# Patient Record
Sex: Male | Born: 1945 | Race: White | Hispanic: No | Marital: Single | Smoking: Current every day smoker
Health system: Southern US, Community
[De-identification: ages and names within clinical notes are randomized; demographics above are authoritative.]

## PROBLEM LIST (undated history)

## (undated) DIAGNOSIS — J449 Chronic obstructive pulmonary disease, unspecified: Secondary | ICD-10-CM

## (undated) DIAGNOSIS — G459 Transient cerebral ischemic attack, unspecified: Secondary | ICD-10-CM

## (undated) DIAGNOSIS — I10 Essential (primary) hypertension: Secondary | ICD-10-CM

## (undated) HISTORY — PX: OTHER SURGICAL HISTORY: SHX169

---

## 2013-10-07 ENCOUNTER — Encounter (HOSPITAL_COMMUNITY): Payer: Self-pay | Admitting: Emergency Medicine

## 2013-10-07 ENCOUNTER — Emergency Department (HOSPITAL_COMMUNITY): Payer: Medicare Other

## 2013-10-07 ENCOUNTER — Emergency Department (HOSPITAL_COMMUNITY)
Admission: EM | Admit: 2013-10-07 | Discharge: 2013-10-07 | Disposition: A | Discharge status: Home / Self Care | Payer: Medicare Other | Attending: Emergency Medicine | Admitting: Emergency Medicine

## 2013-10-07 DIAGNOSIS — Z79899 Other long term (current) drug therapy: Secondary | ICD-10-CM | POA: Insufficient documentation

## 2013-10-07 DIAGNOSIS — F17200 Nicotine dependence, unspecified, uncomplicated: Secondary | ICD-10-CM | POA: Diagnosis not present

## 2013-10-07 DIAGNOSIS — R0602 Shortness of breath: Secondary | ICD-10-CM | POA: Diagnosis present

## 2013-10-07 DIAGNOSIS — J449 Chronic obstructive pulmonary disease, unspecified: Secondary | ICD-10-CM

## 2013-10-07 DIAGNOSIS — Z8673 Personal history of transient ischemic attack (TIA), and cerebral infarction without residual deficits: Secondary | ICD-10-CM | POA: Diagnosis not present

## 2013-10-07 DIAGNOSIS — I10 Essential (primary) hypertension: Secondary | ICD-10-CM | POA: Diagnosis not present

## 2013-10-07 DIAGNOSIS — J441 Chronic obstructive pulmonary disease with (acute) exacerbation: Secondary | ICD-10-CM | POA: Insufficient documentation

## 2013-10-07 HISTORY — DX: Essential (primary) hypertension: I10

## 2013-10-07 HISTORY — DX: Transient cerebral ischemic attack, unspecified: G45.9

## 2013-10-07 HISTORY — DX: Chronic obstructive pulmonary disease, unspecified: J44.9

## 2013-10-07 LAB — CBC
HCT: 37 % — ABNORMAL LOW (ref 39.0–52.0)
Hemoglobin: 13 g/dL (ref 13.0–17.0)
MCH: 30 pg (ref 26.0–34.0)
MCHC: 35.1 g/dL (ref 30.0–36.0)
MCV: 85.5 fL (ref 78.0–100.0)
Platelets: 250 10*3/uL (ref 150–400)
RBC: 4.33 MIL/uL (ref 4.22–5.81)
RDW: 12.3 % (ref 11.5–15.5)
WBC: 12 10*3/uL — ABNORMAL HIGH (ref 4.0–10.5)

## 2013-10-07 LAB — PRO B NATRIURETIC PEPTIDE: Pro B Natriuretic peptide (BNP): 201.2 pg/mL — ABNORMAL HIGH (ref 0–125)

## 2013-10-07 LAB — BASIC METABOLIC PANEL
ANION GAP: 15 (ref 5–15)
BUN: 17 mg/dL (ref 6–23)
CALCIUM: 9.3 mg/dL (ref 8.4–10.5)
CO2: 25 mEq/L (ref 19–32)
Chloride: 90 mEq/L — ABNORMAL LOW (ref 96–112)
Creatinine, Ser: 0.94 mg/dL (ref 0.50–1.35)
GFR calc Af Amer: >90 mL/min (ref >90)
GFR, EST NON AFRICAN AMERICAN: 83 mL/min — AB (ref >90)
Glucose, Bld: 108 mg/dL — ABNORMAL HIGH (ref 70–99)
Potassium: 3.2 mEq/L — ABNORMAL LOW (ref 3.7–5.3)
Sodium: 130 mEq/L — ABNORMAL LOW (ref 137–147)

## 2013-10-07 LAB — I-STAT TROPONIN, ED: Troponin i, poc: 0 ng/mL (ref 0.00–0.08)

## 2013-10-07 MED ORDER — PREDNISONE 10 MG PO TABS: 20.0000 mg | ORAL_TABLET | Freq: Every day | ORAL | Status: DC

## 2013-10-07 MED ORDER — PREDNISONE 20 MG PO TABS
60.0000 mg | ORAL_TABLET | Freq: Once | ORAL | Status: AC
  Administered 2013-10-07: 60 mg via ORAL
  Filled 2013-10-07: qty 3

## 2013-10-07 MED ORDER — IPRATROPIUM BROMIDE 0.02 % IN SOLN
0.5000 mg | Freq: Once | RESPIRATORY_TRACT | Status: AC
  Administered 2013-10-07: 0.5 mg via RESPIRATORY_TRACT
  Filled 2013-10-07: qty 2.5

## 2013-10-07 MED ORDER — ALBUTEROL SULFATE (2.5 MG/3ML) 0.083% IN NEBU
5.0000 mg | INHALATION_SOLUTION | Freq: Once | RESPIRATORY_TRACT | Status: AC
  Administered 2013-10-07: 5 mg via RESPIRATORY_TRACT
  Filled 2013-10-07: qty 6

## 2013-10-07 MED ORDER — ALBUTEROL SULFATE HFA 108 (90 BASE) MCG/ACT IN AERS
2.0000 | INHALATION_SPRAY | RESPIRATORY_TRACT | Status: DC | PRN
  Administered 2013-10-07: 2 via RESPIRATORY_TRACT
  Filled 2013-10-07: qty 6.7

## 2013-10-07 NOTE — Discharge Instructions (Signed)

## 2013-10-07 NOTE — ED Provider Notes (Signed)
CSN: [id number]     Arrival date & time 10/07/13  4098 History   First MD Initiated Contact with Patient 10/07/13 1018     Chief Complaint  Patient presents with  . Shortness of Breath     (Consider location/radiation/quality/duration/timing/severity/associated sxs/prior Treatment) HPI  PCP: [facility] in [place], [place]  Patient to the Emergency Department with complaints of coughing and shortness of breath for the past two days. He states that he does have COPD but feels he may have bronchitis, he says that he has been coughing up mucous but it has been difficult to get the mucous to come up. He has not had any chest pain or fevers. He has not had any abdominal or lower extremity swelling. He does endorse Orthopnea and that he begins to cough if he lays down flat. Symptoms improved with the use of his albuterol inhaler. PMH also positive for hypertension, and a TIA.    Past Medical History  Diagnosis Date  . COPD (chronic obstructive pulmonary disease)   . Hypertension   . Transient ischemic attack (TIA)    Past Surgical History  Procedure Laterality Date  . Hemorrohoid surgery     No family history on file. History  Substance Use Topics  . Smoking status: Current Every Day Smoker  . Smokeless tobacco: Not on file  . Alcohol Use: No    Review of Systems  ROS   Review of Systems  Gen: no weight loss, fevers, chills, night sweats  Eyes: no occular draining, occular pain,  No visual changes  Nose: no epistaxis or rhinorrhea  Mouth: no dental pain, no sore throat  Neck: no neck pain  Lungs: No hemoptysis. + wheezing,coughing and orthopnea CV:  No palpitations, dependent edema or orthopnea. No chest pain Abd: no diarrhea. No nausea or vomiting, No abdominal pain  GU: no dysuria or gross hematuria  MSK:  No muscle weakness, No muscular pain Neuro: no headache, no focal neurologic deficits  Skin: no rash , no wounds Psyche: no complaints of depression or  anxiety    Allergies  Review of patient's allergies indicates no known allergies.  Home Medications   Prior to Admission medications   Medication Sig Start Date End Date Taking? Authorizing Provider  albuterol (PROVENTIL HFA;VENTOLIN HFA) 108 (90 BASE) MCG/ACT inhaler Inhale 2 puffs into the lungs every 6 (six) hours as needed for wheezing or shortness of breath.   Yes Historical Provider, MD  amLODipine (NORVASC) 10 MG tablet Take 10 mg by mouth daily. 07/10/13  Yes Historical Provider, MD  hydrochlorothiazide (HYDRODIURIL) 25 MG tablet Take 25 mg by mouth daily. 09/06/13  Yes Historical Provider, MD  lisinopril (PRINIVIL,ZESTRIL) 40 MG tablet Take 40 mg by mouth daily. 09/06/13  Yes Historical Provider, MD  tiotropium (SPIRIVA) 18 MCG inhalation capsule Place 18 mcg into inhaler and inhale daily.   Yes Historical Provider, MD  predniSONE (DELTASONE) 10 MG tablet Take 2 tablets (20 mg total) by mouth daily. 10/07/13   Yassir Enis Irine Seal, PA-C   BP 116/67  Pulse 82  Temp(Src) 97.7 F (36.5 C) (Temporal)  Resp 23  SpO2 96% Physical Exam  Nursing note and vitals reviewed. Constitutional: He appears well-developed and well-nourished. No distress.  HENT:  Head: Normocephalic and atraumatic.  Eyes: Pupils are equal, round, and reactive to light.  Neck: Normal range of motion. Neck supple.  Cardiovascular: Normal rate and regular rhythm.   Pulmonary/Chest: Accessory muscle usage present. He has decreased breath sounds. He has wheezes (  increased effot of breathing).  Abdominal: Soft.  Neurological: He is alert.  Skin: Skin is warm and dry.    ED Course  Procedures (including critical care time) Labs Review Labs Reviewed  BASIC METABOLIC PANEL - Abnormal; Notable for the following:    Sodium 130 (*)    Potassium 3.2 (*)    Chloride 90 (*)    Glucose, Bld 108 (*)    GFR calc non Af Amer 83 (*)    All other components within normal limits  CBC - Abnormal; Notable for the following:     WBC 12.0 (*)    HCT 37.0 (*)    All other components within normal limits  PRO B NATRIURETIC PEPTIDE - Abnormal; Notable for the following:    Pro B Natriuretic peptide (BNP) 201.2 (*)    All other components within normal limits  I-STAT TROPOININ, ED    Imaging Review Dg Chest 2 View (if Patient Has Fever And/or Copd)  10/07/2013   CLINICAL DATA:  Shortness of breath, COPD  EXAM: CHEST - 2 VIEW  COMPARISON:  None.  FINDINGS: Lungs are markedly hyperinflated compatible with COPD/emphysema. Normal heart size and vascularity. No focal pneumonia, collapse or consolidation. No edema, effusion or pneumothorax. Minor thoracic atherosclerosis evident.  IMPRESSION: Hyperinflation compatible with COPD/emphysema. No superimposed acute process   Electronically Signed   By: Ruel Favors M.D.   On: 10/07/2013 12:21     EKG Interpretation None      MDM   Final diagnoses:  Chronic obstructive pulmonary disease, unspecified COPD, unspecified chronic bronchitis type   Medications  albuterol (PROVENTIL HFA;VENTOLIN HFA) 108 (90 BASE) MCG/ACT inhaler 2 puff (not administered)  albuterol (PROVENTIL) (2.5 MG/3ML) 0.083% nebulizer solution 5 mg (5 mg Nebulization Given 10/07/13 1059)  ipratropium (ATROVENT) nebulizer solution 0.5 mg (0.5 mg Nebulization Given 10/07/13 1059)  predniSONE (DELTASONE) tablet 60 mg (60 mg Oral Given 10/07/13 1111)  albuterol (PROVENTIL) (2.5 MG/3ML) 0.083% nebulizer solution 5 mg (5 mg Nebulization Given 10/07/13 1239)  ipratropium (ATROVENT) nebulizer solution 0.5 mg (0.5 mg Nebulization Given 10/07/13 1239)    Patient given 2 breathing treatments in the ED which significantly helped his symptoms. He reports feeling much better. He was ambulated by myself on the pulse ox and had no SOB, tachycardia and maintained his O2 sats at 96% or higher. His electrolytes are mildly abnormal, he reports drinking only coffee and soda at home. No water. Was given a few cups of water in the  ED and advised to drink more water at home. Dr. Karma Ganja has seen patient as well. Albuterol HFA given for home as well as a prednisone dose pack. He is to follow-up with his PCP.  68 y.o.Spencer Banks's evaluation in the Emergency Department is complete. It has been determined that no acute conditions requiring further emergency intervention are present at this time. The patient/guardian have been advised of the diagnosis and plan. We have discussed signs and symptoms that warrant return to the ED, such as changes or worsening in symptoms.  Vital signs are stable at discharge. Filed Vitals:   10/07/13 1312  BP: 116/67  Pulse: 82  Temp:   Resp: 23    Patient/guardian has voiced understanding and agreed to follow-up with the PCP or specialist.     Dorthula Matas, PA-C 10/07/13 1345

## 2013-10-07 NOTE — ED Notes (Signed)
Called RT for neb.

## 2013-10-07 NOTE — ED Notes (Signed)
Patient's pulse oximetry remained at 94% while ambulating 

## 2013-10-07 NOTE — ED Provider Notes (Signed)
Medical screening examination/treatment/procedure(s) were conducted as a shared visit with non-physician practitioner(s) and myself.  I personally evaluated the patient during the encounter.   EKG Interpretation None     Pt with hx of COPD presenting with wheezing.  Pt with clear lungs after nebs, steroids in the ED.  He has ambulated and pulse ox remained good.  Pt is agreeable with plan for discharge.  No increased work of breathing.    Ethelda Chick, MD 10/07/13 1351

## 2013-10-07 NOTE — ED Notes (Signed)
Pt reports coughing up a lot of mucous and states that at night he cannot lay down and breath.  No extremity swelling.  Pt has copd.

## 2014-08-24 ENCOUNTER — Encounter (HOSPITAL_COMMUNITY): Payer: Self-pay

## 2014-08-24 ENCOUNTER — Emergency Department (HOSPITAL_COMMUNITY): Payer: Medicare Other

## 2014-08-24 ENCOUNTER — Emergency Department (HOSPITAL_COMMUNITY)
Admission: EM | Admit: 2014-08-24 | Discharge: 2014-08-24 | Disposition: A | Discharge status: Home / Self Care | Payer: Medicare Other | Attending: Emergency Medicine | Admitting: Emergency Medicine

## 2014-08-24 DIAGNOSIS — Z7952 Long term (current) use of systemic steroids: Secondary | ICD-10-CM | POA: Insufficient documentation

## 2014-08-24 DIAGNOSIS — R0602 Shortness of breath: Secondary | ICD-10-CM | POA: Diagnosis present

## 2014-08-24 DIAGNOSIS — J438 Other emphysema: Secondary | ICD-10-CM | POA: Diagnosis not present

## 2014-08-24 DIAGNOSIS — I10 Essential (primary) hypertension: Secondary | ICD-10-CM | POA: Insufficient documentation

## 2014-08-24 DIAGNOSIS — Z79899 Other long term (current) drug therapy: Secondary | ICD-10-CM | POA: Diagnosis not present

## 2014-08-24 DIAGNOSIS — Z8673 Personal history of transient ischemic attack (TIA), and cerebral infarction without residual deficits: Secondary | ICD-10-CM | POA: Insufficient documentation

## 2014-08-24 DIAGNOSIS — Z720 Tobacco use: Secondary | ICD-10-CM | POA: Insufficient documentation

## 2014-08-24 LAB — CBC
HCT: 38.8 % — ABNORMAL LOW (ref 39.0–52.0)
Hemoglobin: 14.1 g/dL (ref 13.0–17.0)
MCH: 30.3 pg (ref 26.0–34.0)
MCHC: 36.3 g/dL — AB (ref 30.0–36.0)
MCV: 83.3 fL (ref 78.0–100.0)
PLATELETS: 248 10*3/uL (ref 150–400)
RBC: 4.66 MIL/uL (ref 4.22–5.81)
RDW: 12.7 % (ref 11.5–15.5)
WBC: 8.1 10*3/uL (ref 4.0–10.5)

## 2014-08-24 LAB — BASIC METABOLIC PANEL
ANION GAP: 10 (ref 5–15)
BUN: 14 mg/dL (ref 6–20)
CHLORIDE: 93 mmol/L — AB (ref 101–111)
CO2: 26 mmol/L (ref 22–32)
Calcium: 9.5 mg/dL (ref 8.9–10.3)
Creatinine, Ser: 1.2 mg/dL (ref 0.61–1.24)
GFR calc Af Amer: >60 mL/min (ref >60)
GFR calc non Af Amer: 60 mL/min — ABNORMAL LOW (ref >60)
Glucose, Bld: 122 mg/dL — ABNORMAL HIGH (ref 65–99)
POTASSIUM: 3.2 mmol/L — AB (ref 3.5–5.1)
Sodium: 129 mmol/L — ABNORMAL LOW (ref 135–145)

## 2014-08-24 LAB — D-DIMER, QUANTITATIVE: D-Dimer, Quant: 0.27 ug/mL-FEU (ref 0.00–0.48)

## 2014-08-24 LAB — I-STAT TROPONIN, ED: Troponin i, poc: 0.01 ng/mL (ref 0.00–0.08)

## 2014-08-24 MED ORDER — ZOLPIDEM TARTRATE 5 MG PO TABS: 5.0000 mg | ORAL_TABLET | Freq: Every evening | ORAL | Status: DC | PRN

## 2014-08-24 MED ORDER — ALBUTEROL (5 MG/ML) CONTINUOUS INHALATION SOLN
10.0000 mg/h | INHALATION_SOLUTION | Freq: Once | RESPIRATORY_TRACT | Status: AC
  Administered 2014-08-24: 10 mg/h via RESPIRATORY_TRACT
  Filled 2014-08-24: qty 20

## 2014-08-24 MED ORDER — PREDNISONE 20 MG PO TABS: 20.0000 mg | ORAL_TABLET | Freq: Every day | ORAL | Status: DC

## 2014-08-24 MED ORDER — METHYLPREDNISOLONE SODIUM SUCC 125 MG IJ SOLR
125.0000 mg | Freq: Once | INTRAMUSCULAR | Status: AC
  Administered 2014-08-24: 125 mg via INTRAVENOUS
  Filled 2014-08-24: qty 2

## 2014-08-24 NOTE — ED Notes (Signed)
Pt. Is back in room. 

## 2014-08-24 NOTE — ED Notes (Signed)
Pt stable, ambulatory, states understanding of discharge instructions 

## 2014-08-24 NOTE — ED Notes (Signed)
Pt has COPD and states he started having SOB 2 days ago. Still smokes and states he is now going to quite. Does not sleep propped up but hasn't slept very much. Denies any pain.

## 2014-08-24 NOTE — Discharge Instructions (Signed)

## 2014-08-24 NOTE — ED Provider Notes (Signed)
CSN: [id number]     Arrival date & time 08/24/14  1551 History   First MD Initiated Contact with Patient 08/24/14 1613     Chief Complaint  Patient presents with  . Shortness of Breath     (Consider location/radiation/quality/duration/timing/severity/associated sxs/prior Treatment) HPI Comments: Patient here complaining of worsening shortness breath 2 days with productive cough similar to his prior COPD exacerbations. Denies any fever or anginal type symptoms. Denies any leg pain or swelling. No pruritic component to his symptoms. Has used his home and realizes without relief. No recent vomiting or diarrhea. Symptoms persistent and worse with exertion better with rest.  Patient is a 69 y.o. male presenting with shortness of breath. The history is provided by the patient.  Shortness of Breath   Past Medical History  Diagnosis Date  . COPD (chronic obstructive pulmonary disease)   . Hypertension   . Transient ischemic attack (TIA)    Past Surgical History  Procedure Laterality Date  . Hemorrohoid surgery     No family history on file. History  Substance Use Topics  . Smoking status: Current Every Day Smoker  . Smokeless tobacco: Not on file  . Alcohol Use: No    Review of Systems  Respiratory: Positive for shortness of breath.   All other systems reviewed and are negative.     Allergies  Review of patient's allergies indicates no known allergies.  Home Medications   Prior to Admission medications   Medication Sig Start Date End Date Taking? Authorizing Provider  albuterol (PROVENTIL HFA;VENTOLIN HFA) 108 (90 BASE) MCG/ACT inhaler Inhale 2 puffs into the lungs every 6 (six) hours as needed for wheezing or shortness of breath.    Historical Provider, MD  amLODipine (NORVASC) 10 MG tablet Take 10 mg by mouth daily. 07/10/13   Historical Provider, MD  hydrochlorothiazide (HYDRODIURIL) 25 MG tablet Take 25 mg by mouth daily. 09/06/13   Historical Provider, MD  lisinopril  (PRINIVIL,ZESTRIL) 40 MG tablet Take 40 mg by mouth daily. 09/06/13   Historical Provider, MD  predniSONE (DELTASONE) 10 MG tablet Take 2 tablets (20 mg total) by mouth daily. 10/07/13   Tiffany Neva SeatGreene, PA-C  tiotropium (SPIRIVA) 18 MCG inhalation capsule Place 18 mcg into inhaler and inhale daily.    Historical Provider, MD   BP 113/73 mmHg  Pulse 59  Temp(Src) 97.3 F (36.3 C) (Oral)  Resp 13  Ht 6\' 1"  (1.854 m)  Wt 150 lb (68.04 kg)  BMI 19.79 kg/m2  SpO2 94% Physical Exam  Constitutional: He is oriented to person, place, and time. He appears well-developed and well-nourished.  Non-toxic appearance. No distress.  HENT:  Head: Normocephalic and atraumatic.  Eyes: Conjunctivae, EOM and lids are normal. Pupils are equal, round, and reactive to light.  Neck: Normal range of motion. Neck supple. No tracheal deviation present. No thyroid mass present.  Cardiovascular: Normal rate, regular rhythm and normal heart sounds.  Exam reveals no gallop.   No murmur heard. Pulmonary/Chest: Effort normal. No stridor. No respiratory distress. He has decreased breath sounds. He has no wheezes. He has no rhonchi. He has no rales.  Abdominal: Soft. Normal appearance and bowel sounds are normal. He exhibits no distension. There is no tenderness. There is no rebound and no CVA tenderness.  Musculoskeletal: Normal range of motion. He exhibits no edema or tenderness.  Neurological: He is alert and oriented to person, place, and time. He has normal strength. No cranial nerve deficit or sensory deficit. GCS eye subscore is  4. GCS verbal subscore is 5. GCS motor subscore is 6.  Skin: Skin is warm and dry. No abrasion and no rash noted.  Psychiatric: He has a normal mood and affect. His speech is normal and behavior is normal.  Nursing note and vitals reviewed.   ED Course  Procedures (including critical care time) Labs Review Labs Reviewed  BASIC METABOLIC PANEL - Abnormal; Notable for the following:     Sodium 129 (*)    Potassium 3.2 (*)    Chloride 93 (*)    Glucose, Bld 122 (*)    GFR calc non Af Amer 60 (*)    All other components within normal limits  CBC  D-DIMER, QUANTITATIVE (NOT AT [facility])  I-STAT TROPOININ, ED    Imaging Review Dg Chest 2 View (if Patient Has Fever And/or Copd)  08/24/2014   CLINICAL DATA:  Patient with shortness of breath for 3 days.  EXAM: CHEST  2 VIEW  COMPARISON:  Chest radiograph 10/07/2013  FINDINGS: Stable cardiac and mediastinal contours. Pulmonary hyperinflation. No consolidative pulmonary opacities. No pleural effusion or pneumothorax. Regional skeleton is unremarkable.  IMPRESSION: No active cardiopulmonary disease.   Electronically Signed   By: Annia Belt M.D.   On: 08/24/2014 16:33     EKG Interpretation   Date/Time:  Saturday August 24 2014 15:54:18 EDT Ventricular Rate:  75 PR Interval:  158 QRS Duration: 98 QT Interval:  396 QTC Calculation: 442 R Axis:   82 Text Interpretation:  Normal sinus rhythm Right atrial enlargement  Pulmonary disease pattern Nonspecific ST abnormality Abnormal QRS-T angle,  consider primary T wave abnormality Abnormal ECG No significant change  since last tracing Confirmed by Amaree Loisel  MD, Mauro Arps ([id number]) on 08/24/2014  4:29:40 PM      MDM   Final diagnoses:  None   Pt given albuterol and solumedrol, wheezing improved, pt abulated without desaturating, stable for d/c    Lorre Nick, MD 08/24/14 1952

## 2014-09-03 ENCOUNTER — Encounter (HOSPITAL_COMMUNITY): Payer: Self-pay | Admitting: Emergency Medicine

## 2014-09-03 ENCOUNTER — Emergency Department (HOSPITAL_COMMUNITY)
Admission: EM | Admit: 2014-09-03 | Discharge: 2014-09-03 | Discharge status: Against Medical Advice | Payer: Medicare Other | Attending: Emergency Medicine | Admitting: Emergency Medicine

## 2014-09-03 DIAGNOSIS — K5900 Constipation, unspecified: Secondary | ICD-10-CM | POA: Diagnosis present

## 2014-09-03 DIAGNOSIS — I10 Essential (primary) hypertension: Secondary | ICD-10-CM | POA: Insufficient documentation

## 2014-09-03 DIAGNOSIS — Z720 Tobacco use: Secondary | ICD-10-CM | POA: Insufficient documentation

## 2014-09-03 DIAGNOSIS — J449 Chronic obstructive pulmonary disease, unspecified: Secondary | ICD-10-CM | POA: Diagnosis not present

## 2014-09-03 LAB — CBC WITH DIFFERENTIAL/PLATELET
Basophils Absolute: 0 10*3/uL (ref 0.0–0.1)
Basophils Relative: 0 % (ref 0–1)
EOS PCT: 0 % (ref 0–5)
Eosinophils Absolute: 0 10*3/uL (ref 0.0–0.7)
HCT: 37.8 % — ABNORMAL LOW (ref 39.0–52.0)
HEMOGLOBIN: 13 g/dL (ref 13.0–17.0)
Lymphocytes Relative: 14 % (ref 12–46)
Lymphs Abs: 2 10*3/uL (ref 0.7–4.0)
MCH: 29.7 pg (ref 26.0–34.0)
MCHC: 34.4 g/dL (ref 30.0–36.0)
MCV: 86.5 fL (ref 78.0–100.0)
Monocytes Absolute: 0.7 10*3/uL (ref 0.1–1.0)
Monocytes Relative: 5 % (ref 3–12)
NEUTROS PCT: 81 % — AB (ref 43–77)
Neutro Abs: 11.9 10*3/uL — ABNORMAL HIGH (ref 1.7–7.7)
PLATELETS: 293 10*3/uL (ref 150–400)
RBC: 4.37 MIL/uL (ref 4.22–5.81)
RDW: 13 % (ref 11.5–15.5)
WBC: 14.6 10*3/uL — ABNORMAL HIGH (ref 4.0–10.5)

## 2014-09-03 LAB — URINALYSIS, ROUTINE W REFLEX MICROSCOPIC
Bilirubin Urine: NEGATIVE
Glucose, UA: NEGATIVE mg/dL
Hgb urine dipstick: NEGATIVE
Ketones, ur: NEGATIVE mg/dL
Leukocytes, UA: NEGATIVE
NITRITE: NEGATIVE
Protein, ur: NEGATIVE mg/dL
Specific Gravity, Urine: 1.013 (ref 1.005–1.030)
Urobilinogen, UA: 0.2 mg/dL (ref 0.0–1.0)
pH: 7 (ref 5.0–8.0)

## 2014-09-03 LAB — COMPREHENSIVE METABOLIC PANEL
ALK PHOS: 60 U/L (ref 38–126)
ALT: 25 U/L (ref 17–63)
ANION GAP: 8 (ref 5–15)
AST: 21 U/L (ref 15–41)
Albumin: 3.7 g/dL (ref 3.5–5.0)
BUN: 12 mg/dL (ref 6–20)
CO2: 32 mmol/L (ref 22–32)
Calcium: 9.3 mg/dL (ref 8.9–10.3)
Chloride: 91 mmol/L — ABNORMAL LOW (ref 101–111)
Creatinine, Ser: 1.13 mg/dL (ref 0.61–1.24)
GFR calc Af Amer: >60 mL/min (ref >60)
Glucose, Bld: 100 mg/dL — ABNORMAL HIGH (ref 65–99)
POTASSIUM: 3.7 mmol/L (ref 3.5–5.1)
Sodium: 131 mmol/L — ABNORMAL LOW (ref 135–145)
TOTAL PROTEIN: 6.3 g/dL — AB (ref 6.5–8.1)
Total Bilirubin: 0.9 mg/dL (ref 0.3–1.2)

## 2014-09-03 NOTE — ED Notes (Signed)
Called for room placement x 4, no answer.

## 2014-09-03 NOTE — ED Notes (Signed)
Pt. reports constipation with intermittent abdominal cramping onset this week , pt. took laxative yesterday with relief, denies nausea or vomitting .

## 2015-02-01 ENCOUNTER — Emergency Department (HOSPITAL_COMMUNITY): Payer: Medicare Other

## 2015-02-01 ENCOUNTER — Encounter (HOSPITAL_COMMUNITY): Payer: Self-pay | Admitting: Emergency Medicine

## 2015-02-01 ENCOUNTER — Emergency Department (HOSPITAL_COMMUNITY)
Admission: EM | Admit: 2015-02-01 | Discharge: 2015-02-01 | Disposition: A | Discharge status: Home / Self Care | Payer: Medicare Other | Attending: Emergency Medicine | Admitting: Emergency Medicine

## 2015-02-01 DIAGNOSIS — Y9389 Activity, other specified: Secondary | ICD-10-CM | POA: Insufficient documentation

## 2015-02-01 DIAGNOSIS — I10 Essential (primary) hypertension: Secondary | ICD-10-CM | POA: Insufficient documentation

## 2015-02-01 DIAGNOSIS — T2000XA Burn of unspecified degree of head, face, and neck, unspecified site, initial encounter: Secondary | ICD-10-CM | POA: Diagnosis present

## 2015-02-01 DIAGNOSIS — T22131A Burn of first degree of right upper arm, initial encounter: Secondary | ICD-10-CM | POA: Diagnosis not present

## 2015-02-01 DIAGNOSIS — T2010XA Burn of first degree of head, face, and neck, unspecified site, initial encounter: Secondary | ICD-10-CM | POA: Diagnosis not present

## 2015-02-01 DIAGNOSIS — Y92099 Unspecified place in other non-institutional residence as the place of occurrence of the external cause: Secondary | ICD-10-CM | POA: Diagnosis not present

## 2015-02-01 DIAGNOSIS — Y998 Other external cause status: Secondary | ICD-10-CM | POA: Insufficient documentation

## 2015-02-01 DIAGNOSIS — T300 Burn of unspecified body region, unspecified degree: Secondary | ICD-10-CM

## 2015-02-01 DIAGNOSIS — J441 Chronic obstructive pulmonary disease with (acute) exacerbation: Secondary | ICD-10-CM | POA: Diagnosis not present

## 2015-02-01 DIAGNOSIS — X000XXA Exposure to flames in uncontrolled fire in building or structure, initial encounter: Secondary | ICD-10-CM | POA: Insufficient documentation

## 2015-02-01 DIAGNOSIS — Z87891 Personal history of nicotine dependence: Secondary | ICD-10-CM | POA: Diagnosis not present

## 2015-02-01 LAB — COMPREHENSIVE METABOLIC PANEL
ALT: 22 U/L (ref 17–63)
AST: 35 U/L (ref 15–41)
Albumin: 4.2 g/dL (ref 3.5–5.0)
Alkaline Phosphatase: 64 U/L (ref 38–126)
Anion gap: 12 (ref 5–15)
BUN: 13 mg/dL (ref 6–20)
CHLORIDE: 97 mmol/L — AB (ref 101–111)
CO2: 26 mmol/L (ref 22–32)
Calcium: 9.9 mg/dL (ref 8.9–10.3)
Creatinine, Ser: 1.23 mg/dL (ref 0.61–1.24)
GFR, EST NON AFRICAN AMERICAN: 57 mL/min — AB (ref >60)
Glucose, Bld: 92 mg/dL (ref 65–99)
POTASSIUM: 3.4 mmol/L — AB (ref 3.5–5.1)
Sodium: 135 mmol/L (ref 135–145)
Total Bilirubin: 0.9 mg/dL (ref 0.3–1.2)
Total Protein: 6.8 g/dL (ref 6.5–8.1)

## 2015-02-01 LAB — CBC WITH DIFFERENTIAL/PLATELET
BASOS ABS: 0 10*3/uL (ref 0.0–0.1)
Basophils Relative: 0 %
EOS ABS: 0.2 10*3/uL (ref 0.0–0.7)
EOS PCT: 1 %
HCT: 38 % — ABNORMAL LOW (ref 39.0–52.0)
Hemoglobin: 13.1 g/dL (ref 13.0–17.0)
LYMPHS PCT: 20 %
Lymphs Abs: 2.3 10*3/uL (ref 0.7–4.0)
MCH: 30.2 pg (ref 26.0–34.0)
MCHC: 34.5 g/dL (ref 30.0–36.0)
MCV: 87.6 fL (ref 78.0–100.0)
MONO ABS: 0.8 10*3/uL (ref 0.1–1.0)
Monocytes Relative: 7 %
Neutro Abs: 8.5 10*3/uL — ABNORMAL HIGH (ref 1.7–7.7)
Neutrophils Relative %: 72 %
PLATELETS: 300 10*3/uL (ref 150–400)
RBC: 4.34 MIL/uL (ref 4.22–5.81)
RDW: 13.3 % (ref 11.5–15.5)
WBC: 11.8 10*3/uL — ABNORMAL HIGH (ref 4.0–10.5)

## 2015-02-01 MED ORDER — BACITRACIN ZINC 500 UNIT/GM EX OINT: 1.0000 "application " | TOPICAL_OINTMENT | Freq: Two times a day (BID) | CUTANEOUS | Status: DC

## 2015-02-01 MED ORDER — SODIUM CHLORIDE 0.9 % IV BOLUS (SEPSIS)
1000.0000 mL | Freq: Once | INTRAVENOUS | Status: AC
  Administered 2015-02-01: 1000 mL via INTRAVENOUS

## 2015-02-01 MED ORDER — ALBUTEROL SULFATE HFA 108 (90 BASE) MCG/ACT IN AERS
1.0000 | INHALATION_SPRAY | Freq: Once | RESPIRATORY_TRACT | Status: AC
  Administered 2015-02-01: 1 via RESPIRATORY_TRACT
  Filled 2015-02-01: qty 6.7

## 2015-02-01 MED ORDER — HYDROMORPHONE HCL 1 MG/ML IJ SOLN
1.0000 mg | Freq: Once | INTRAMUSCULAR | Status: AC
  Administered 2015-02-01: 1 mg via INTRAVENOUS
  Filled 2015-02-01: qty 1

## 2015-02-01 MED ORDER — HYDROMORPHONE HCL 1 MG/ML IJ SOLN
0.5000 mg | Freq: Once | INTRAMUSCULAR | Status: DC
  Filled 2015-02-01: qty 1

## 2015-02-01 MED ORDER — HYDROCODONE-ACETAMINOPHEN 5-325 MG PO TABS: 1.0000 | ORAL_TABLET | ORAL | Status: DC | PRN
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
## 2015-02-01 NOTE — Discharge Instructions (Signed)
Burn Care °Your skin is a natural barrier to infection. It is the largest organ of your body. Burns damage this natural protection. To help prevent infection, it is very important to follow your caregiver's instructions in the care of your burn. °Burns are classified as: °· First degree. There is only redness of the skin (erythema). No scarring is expected. °· Second degree. There is blistering of the skin. Scarring may occur with deeper burns. °· Third degree. All layers of the skin are injured, and scarring is expected. °HOME CARE INSTRUCTIONS  °· Wash your hands well before changing your bandage. °· Change your bandage as often as directed by your caregiver. °¨ Remove the old bandage. If the bandage sticks, you may soak it off with cool, clean water. °¨ Cleanse the burn thoroughly but gently with mild soap and water. °¨ Pat the area dry with a clean, dry cloth. °¨ Apply a thin layer of antibacterial cream to the burn. °¨ Apply a clean bandage as instructed by your caregiver. °¨ Keep the bandage as clean and dry as possible. °· Elevate the affected area for the first 24 hours, then as instructed by your caregiver. °· Only take over-the-counter or prescription medicines for pain, discomfort, or fever as directed by your caregiver. °SEEK IMMEDIATE MEDICAL CARE IF:  °· You develop excessive pain. °· You develop redness, tenderness, swelling, or red streaks near the burn. °· The burned area develops yellowish-white fluid (pus) or a bad smell. °· You have a fever. °MAKE SURE YOU:  °· Understand these instructions. °· Will watch your condition. °· Will get help right away if you are not doing well or get worse. °  °This information is not intended to replace advice given to you by your health care provider. Make sure you discuss any questions you have with your health care provider. °  °Document Released: 02/01/2005 Document Revised: 04/26/2011 Document Reviewed: 06/24/2010 °Elsevier Interactive Patient Education ©2016  [employer] ° °
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
## 2015-02-03 ENCOUNTER — Emergency Department (HOSPITAL_COMMUNITY)
Admission: EM | Admit: 2015-02-03 | Discharge: 2015-02-03 | Disposition: A | Discharge status: Home / Self Care | Payer: Medicare Other | Attending: Emergency Medicine | Admitting: Emergency Medicine

## 2015-02-03 ENCOUNTER — Encounter (HOSPITAL_COMMUNITY): Payer: Self-pay | Admitting: Emergency Medicine

## 2015-02-03 DIAGNOSIS — T2020XA Burn of second degree of head, face, and neck, unspecified site, initial encounter: Secondary | ICD-10-CM | POA: Diagnosis not present

## 2015-02-03 DIAGNOSIS — Y9289 Other specified places as the place of occurrence of the external cause: Secondary | ICD-10-CM | POA: Diagnosis not present

## 2015-02-03 DIAGNOSIS — Y9389 Activity, other specified: Secondary | ICD-10-CM | POA: Insufficient documentation

## 2015-02-03 DIAGNOSIS — F17200 Nicotine dependence, unspecified, uncomplicated: Secondary | ICD-10-CM | POA: Diagnosis not present

## 2015-02-03 DIAGNOSIS — I10 Essential (primary) hypertension: Secondary | ICD-10-CM | POA: Insufficient documentation

## 2015-02-03 DIAGNOSIS — Y998 Other external cause status: Secondary | ICD-10-CM | POA: Diagnosis not present

## 2015-02-03 DIAGNOSIS — J449 Chronic obstructive pulmonary disease, unspecified: Secondary | ICD-10-CM | POA: Diagnosis not present

## 2015-02-03 DIAGNOSIS — X088XXA Exposure to other specified smoke, fire and flames, initial encounter: Secondary | ICD-10-CM | POA: Insufficient documentation

## 2015-02-03 DIAGNOSIS — Z79899 Other long term (current) drug therapy: Secondary | ICD-10-CM | POA: Diagnosis not present

## 2015-02-03 DIAGNOSIS — T23201A Burn of second degree of right hand, unspecified site, initial encounter: Secondary | ICD-10-CM | POA: Insufficient documentation

## 2015-02-03 DIAGNOSIS — Z7952 Long term (current) use of systemic steroids: Secondary | ICD-10-CM | POA: Insufficient documentation

## 2015-02-03 DIAGNOSIS — T2000XA Burn of unspecified degree of head, face, and neck, unspecified site, initial encounter: Secondary | ICD-10-CM

## 2015-02-03 DIAGNOSIS — Z8673 Personal history of transient ischemic attack (TIA), and cerebral infarction without residual deficits: Secondary | ICD-10-CM | POA: Insufficient documentation

## 2015-02-03 MED ORDER — HYDROCODONE-ACETAMINOPHEN 5-325 MG PO TABS
2.0000 | ORAL_TABLET | Freq: Once | ORAL | Status: AC
  Administered 2015-02-03: 2 via ORAL
  Filled 2015-02-03: qty 2
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
## 2015-02-03 NOTE — ED Notes (Signed)
Patient discharged with friend.

## 2015-02-03 NOTE — ED Notes (Signed)
MD at bedside. 
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
## 2015-02-03 NOTE — ED Notes (Signed)
Denies any difficulty breathing.
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
## 2015-02-03 NOTE — ED Notes (Signed)
PA at bedside.

## 2015-04-08 ENCOUNTER — Encounter (HOSPITAL_COMMUNITY): Payer: Self-pay | Admitting: Emergency Medicine

## 2015-04-08 ENCOUNTER — Inpatient Hospital Stay (HOSPITAL_COMMUNITY)
Admission: EM | Admit: 2015-04-08 | Discharge: 2015-04-11 | DRG: 190 | Disposition: A | Discharge status: Home Health | Payer: Medicare Other | Attending: Internal Medicine | Admitting: Internal Medicine

## 2015-04-08 ENCOUNTER — Emergency Department (HOSPITAL_COMMUNITY): Payer: Medicare Other

## 2015-04-08 DIAGNOSIS — E43 Unspecified severe protein-calorie malnutrition: Secondary | ICD-10-CM | POA: Insufficient documentation

## 2015-04-08 DIAGNOSIS — Z79899 Other long term (current) drug therapy: Secondary | ICD-10-CM

## 2015-04-08 DIAGNOSIS — R634 Abnormal weight loss: Secondary | ICD-10-CM

## 2015-04-08 DIAGNOSIS — R630 Anorexia: Secondary | ICD-10-CM | POA: Diagnosis present

## 2015-04-08 DIAGNOSIS — J45901 Unspecified asthma with (acute) exacerbation: Secondary | ICD-10-CM | POA: Diagnosis present

## 2015-04-08 DIAGNOSIS — Z681 Body mass index (BMI) 19 or less, adult: Secondary | ICD-10-CM

## 2015-04-08 DIAGNOSIS — F17210 Nicotine dependence, cigarettes, uncomplicated: Secondary | ICD-10-CM | POA: Diagnosis present

## 2015-04-08 DIAGNOSIS — E876 Hypokalemia: Secondary | ICD-10-CM

## 2015-04-08 DIAGNOSIS — T502X5A Adverse effect of carbonic-anhydrase inhibitors, benzothiadiazides and other diuretics, initial encounter: Secondary | ICD-10-CM | POA: Diagnosis present

## 2015-04-08 DIAGNOSIS — Z9981 Dependence on supplemental oxygen: Secondary | ICD-10-CM | POA: Diagnosis not present

## 2015-04-08 DIAGNOSIS — Z7951 Long term (current) use of inhaled steroids: Secondary | ICD-10-CM | POA: Diagnosis not present

## 2015-04-08 DIAGNOSIS — R0602 Shortness of breath: Secondary | ICD-10-CM | POA: Diagnosis not present

## 2015-04-08 DIAGNOSIS — E871 Hypo-osmolality and hyponatremia: Secondary | ICD-10-CM | POA: Diagnosis present

## 2015-04-08 DIAGNOSIS — E860 Dehydration: Secondary | ICD-10-CM | POA: Diagnosis present

## 2015-04-08 DIAGNOSIS — I10 Essential (primary) hypertension: Secondary | ICD-10-CM | POA: Diagnosis present

## 2015-04-08 DIAGNOSIS — J441 Chronic obstructive pulmonary disease with (acute) exacerbation: Principal | ICD-10-CM | POA: Diagnosis present

## 2015-04-08 DIAGNOSIS — Z8673 Personal history of transient ischemic attack (TIA), and cerebral infarction without residual deficits: Secondary | ICD-10-CM | POA: Diagnosis not present

## 2015-04-08 DIAGNOSIS — E785 Hyperlipidemia, unspecified: Secondary | ICD-10-CM | POA: Diagnosis present

## 2015-04-08 LAB — CBC
HCT: 36.9 % — ABNORMAL LOW (ref 39.0–52.0)
Hemoglobin: 12.5 g/dL — ABNORMAL LOW (ref 13.0–17.0)
MCH: 28.7 pg (ref 26.0–34.0)
MCHC: 33.9 g/dL (ref 30.0–36.0)
MCV: 84.6 fL (ref 78.0–100.0)
PLATELETS: 302 10*3/uL (ref 150–400)
RBC: 4.36 MIL/uL (ref 4.22–5.81)
RDW: 12.7 % (ref 11.5–15.5)
WBC: 8 10*3/uL (ref 4.0–10.5)

## 2015-04-08 LAB — COMPREHENSIVE METABOLIC PANEL
ALK PHOS: 68 U/L (ref 38–126)
ALT: 30 U/L (ref 17–63)
ANION GAP: 15 (ref 5–15)
AST: 43 U/L — ABNORMAL HIGH (ref 15–41)
Albumin: 3.4 g/dL — ABNORMAL LOW (ref 3.5–5.0)
BILIRUBIN TOTAL: 1.2 mg/dL (ref 0.3–1.2)
BUN: 36 mg/dL — ABNORMAL HIGH (ref 6–20)
CALCIUM: 9.1 mg/dL (ref 8.9–10.3)
CO2: 26 mmol/L (ref 22–32)
CREATININE: 1.23 mg/dL (ref 0.61–1.24)
Chloride: 90 mmol/L — ABNORMAL LOW (ref 101–111)
GFR, EST NON AFRICAN AMERICAN: 57 mL/min — AB (ref >60)
Glucose, Bld: 142 mg/dL — ABNORMAL HIGH (ref 65–99)
Potassium: 2.8 mmol/L — ABNORMAL LOW (ref 3.5–5.1)
Sodium: 131 mmol/L — ABNORMAL LOW (ref 135–145)
TOTAL PROTEIN: 7.2 g/dL (ref 6.5–8.1)

## 2015-04-08 LAB — TROPONIN I

## 2015-04-08 LAB — TSH: TSH: 0.073 u[IU]/mL — AB (ref 0.350–4.500)

## 2015-04-08 LAB — MAGNESIUM: MAGNESIUM: 2.4 mg/dL (ref 1.7–2.4)

## 2015-04-08 LAB — BRAIN NATRIURETIC PEPTIDE: B Natriuretic Peptide: 54.2 pg/mL (ref 0.0–100.0)

## 2015-04-08 MED ORDER — METHYLPREDNISOLONE SODIUM SUCC 125 MG IJ SOLR: 125.0000 mg | Freq: Once | INTRAMUSCULAR | Status: DC

## 2015-04-08 MED ORDER — SODIUM CHLORIDE 0.9% FLUSH
3.0000 mL | Freq: Two times a day (BID) | INTRAVENOUS | Status: DC
  Administered 2015-04-08 – 2015-04-11 (×6): 3 mL via INTRAVENOUS

## 2015-04-08 MED ORDER — PANTOPRAZOLE SODIUM 40 MG PO TBEC
40.0000 mg | DELAYED_RELEASE_TABLET | Freq: Every day | ORAL | Status: DC
  Administered 2015-04-08 – 2015-04-11 (×4): 40 mg via ORAL
  Filled 2015-04-08 (×4): qty 1

## 2015-04-08 MED ORDER — ALBUTEROL SULFATE HFA 108 (90 BASE) MCG/ACT IN AERS: 2.0000 | INHALATION_SPRAY | Freq: Four times a day (QID) | RESPIRATORY_TRACT | Status: DC | PRN

## 2015-04-08 MED ORDER — VITAMINS A & D EX OINT
TOPICAL_OINTMENT | CUTANEOUS | Status: AC
  Administered 2015-04-08: 5
  Filled 2015-04-08: qty 5

## 2015-04-08 MED ORDER — ACETAMINOPHEN 325 MG PO TABS
650.0000 mg | ORAL_TABLET | Freq: Four times a day (QID) | ORAL | Status: DC | PRN
Start: 2015-04-08 — End: 2015-04-11

## 2015-04-08 MED ORDER — METHYLPREDNISOLONE SODIUM SUCC 125 MG IJ SOLR: 80.0000 mg | Freq: Two times a day (BID) | INTRAMUSCULAR | Status: DC

## 2015-04-08 MED ORDER — TIOTROPIUM BROMIDE MONOHYDRATE 18 MCG IN CAPS
18.0000 ug | ORAL_CAPSULE | Freq: Every day | RESPIRATORY_TRACT | Status: DC
  Filled 2015-04-08: qty 5

## 2015-04-08 MED ORDER — MOMETASONE FUROATE 100 MCG/ACT IN AERO: 1.0000 | INHALATION_SPRAY | Freq: Every day | RESPIRATORY_TRACT | Status: DC | PRN

## 2015-04-08 MED ORDER — ACETAMINOPHEN 650 MG RE SUPP: 650.0000 mg | Freq: Four times a day (QID) | RECTAL | Status: DC | PRN

## 2015-04-08 MED ORDER — POTASSIUM CHLORIDE CRYS ER 20 MEQ PO TBCR
40.0000 meq | EXTENDED_RELEASE_TABLET | Freq: Once | ORAL | Status: AC
  Administered 2015-04-08: 40 meq via ORAL
  Filled 2015-04-08: qty 2

## 2015-04-08 MED ORDER — METOPROLOL TARTRATE 50 MG PO TABS
50.0000 mg | ORAL_TABLET | Freq: Two times a day (BID) | ORAL | Status: DC
  Administered 2015-04-08 – 2015-04-11 (×6): 50 mg via ORAL
  Filled 2015-04-08 (×6): qty 1

## 2015-04-08 MED ORDER — ALBUTEROL SULFATE (2.5 MG/3ML) 0.083% IN NEBU
5.0000 mg | INHALATION_SOLUTION | RESPIRATORY_TRACT | Status: AC
  Administered 2015-04-08 (×2): 5 mg via RESPIRATORY_TRACT
  Filled 2015-04-08 (×2): qty 6

## 2015-04-08 MED ORDER — MAGNESIUM SULFATE 2 GM/50ML IV SOLN
2.0000 g | Freq: Once | INTRAVENOUS | Status: AC
  Administered 2015-04-08: 2 g via INTRAVENOUS
  Filled 2015-04-08: qty 50

## 2015-04-08 MED ORDER — AMLODIPINE BESYLATE 10 MG PO TABS
10.0000 mg | ORAL_TABLET | Freq: Every day | ORAL | Status: DC
  Administered 2015-04-09 – 2015-04-11 (×3): 10 mg via ORAL
  Filled 2015-04-08 (×3): qty 1

## 2015-04-08 MED ORDER — BUDESONIDE 0.25 MG/2ML IN SUSP: 0.2500 mg | Freq: Two times a day (BID) | RESPIRATORY_TRACT | Status: DC | PRN

## 2015-04-08 MED ORDER — LEVALBUTEROL HCL 0.63 MG/3ML IN NEBU
0.6300 mg | INHALATION_SOLUTION | Freq: Four times a day (QID) | RESPIRATORY_TRACT | Status: DC | PRN
  Administered 2015-04-09: 0.63 mg via RESPIRATORY_TRACT
  Filled 2015-04-08: qty 3

## 2015-04-08 MED ORDER — FLUTICASONE PROPIONATE HFA 44 MCG/ACT IN AERO: 2.0000 | INHALATION_SPRAY | Freq: Two times a day (BID) | RESPIRATORY_TRACT | Status: DC | PRN

## 2015-04-08 MED ORDER — ONDANSETRON HCL 4 MG PO TABS: 4.0000 mg | ORAL_TABLET | Freq: Four times a day (QID) | ORAL | Status: DC | PRN

## 2015-04-08 MED ORDER — LEVOFLOXACIN 500 MG PO TABS
500.0000 mg | ORAL_TABLET | Freq: Every day | ORAL | Status: DC
  Administered 2015-04-08 – 2015-04-11 (×4): 500 mg via ORAL
  Filled 2015-04-08 (×4): qty 1

## 2015-04-08 MED ORDER — ENOXAPARIN SODIUM 40 MG/0.4ML ~~LOC~~ SOLN
40.0000 mg | SUBCUTANEOUS | Status: DC
  Administered 2015-04-08 – 2015-04-10 (×3): 40 mg via SUBCUTANEOUS
  Filled 2015-04-08 (×4): qty 0.4

## 2015-04-08 MED ORDER — OXYCODONE HCL 5 MG PO TABS: 5.0000 mg | ORAL_TABLET | ORAL | Status: DC | PRN

## 2015-04-08 MED ORDER — IPRATROPIUM BROMIDE 0.02 % IN SOLN
0.5000 mg | Freq: Four times a day (QID) | RESPIRATORY_TRACT | Status: DC
  Administered 2015-04-08: 0.5 mg via RESPIRATORY_TRACT
  Filled 2015-04-08: qty 2.5

## 2015-04-08 MED ORDER — ALBUTEROL SULFATE (2.5 MG/3ML) 0.083% IN NEBU: 2.5000 mg | INHALATION_SOLUTION | Freq: Four times a day (QID) | RESPIRATORY_TRACT | Status: DC | PRN

## 2015-04-08 MED ORDER — ONDANSETRON HCL 4 MG/2ML IJ SOLN: 4.0000 mg | Freq: Four times a day (QID) | INTRAMUSCULAR | Status: DC | PRN

## 2015-04-08 MED ORDER — POTASSIUM CHLORIDE IN NACL 40-0.9 MEQ/L-% IV SOLN
INTRAVENOUS | Status: DC
  Administered 2015-04-08 – 2015-04-09 (×2): 100 mL/h via INTRAVENOUS
  Filled 2015-04-08 (×5): qty 1000

## 2015-04-08 MED ORDER — DOCUSATE SODIUM 100 MG PO CAPS
100.0000 mg | ORAL_CAPSULE | Freq: Two times a day (BID) | ORAL | Status: DC
  Administered 2015-04-08 – 2015-04-11 (×6): 100 mg via ORAL
  Filled 2015-04-08 (×6): qty 1

## 2015-04-08 MED ORDER — GUAIFENESIN ER 600 MG PO TB12
600.0000 mg | ORAL_TABLET | Freq: Two times a day (BID) | ORAL | Status: DC | PRN
  Administered 2015-04-09: 600 mg via ORAL
  Filled 2015-04-08: qty 1

## 2015-04-08 MED ORDER — METHYLPREDNISOLONE SODIUM SUCC 125 MG IJ SOLR
80.0000 mg | Freq: Two times a day (BID) | INTRAMUSCULAR | Status: DC
  Administered 2015-04-08 – 2015-04-11 (×6): 80 mg via INTRAVENOUS
  Filled 2015-04-08 (×6): qty 2

## 2015-04-08 MED ORDER — ROSUVASTATIN CALCIUM 20 MG PO TABS
20.0000 mg | ORAL_TABLET | Freq: Every day | ORAL | Status: DC
  Administered 2015-04-08 – 2015-04-10 (×3): 20 mg via ORAL
  Filled 2015-04-08 (×4): qty 1

## 2015-04-08 MED ORDER — POTASSIUM CHLORIDE CRYS ER 20 MEQ PO TBCR: 40.0000 meq | EXTENDED_RELEASE_TABLET | Freq: Once | ORAL | Status: DC

## 2015-04-08 NOTE — H&P (Signed)
[facility] History and Physical  Kaliq Lege ZHY:[id number] DOB: 1946/01/14 DOA: 04/08/2015  Referring physician:   PCP: Pcp Not In System   Chief Complaint: Shortness of breath  HPI:  70 year old male with a history of hypertension, smoking, presents to the ER with a five-day history of cough, progressively getting worse associated with shortness of breath, worsening dyspnea on exertion. Ambulation is limited by coughing spells and weakness. Patient admits to smoking a pack of cigarettes a day but has not smoked in 5 days. Patient is currently on home oxygen, 2 L at night. He had an appointment to be seen by his PCP this week for worsening dyspnea. No relief at home from albuterol inhaler. Patient received nebulizer treatments and seems to be mildly improved. In the ER found to have COPD exacerbation, chest x-ray no pneumonia, no fever, tachycardic in the 120s, requiring 2 L of oxygen. Patient being admitted for COPD exacerbation      Review of Systems: negative for the following  Constitutional: Denies fever, chills, diaphoresis, appetite change and fatigue.  HEENT: Denies photophobia, eye pain, redness, hearing loss, ear pain, congestion, sore throat, rhinorrhea, sneezing, mouth sores, trouble swallowing, neck pain, neck stiffness and tinnitus.  Respiratory: Positive for shortness of breath, cough, chest tightness and wheezing.,  Cardiovascular: Denies chest pain, palpitations and leg swelling.  Gastrointestinal: Denies nausea, vomiting, abdominal pain, diarrhea, constipation, blood in stool and abdominal distention.  Genitourinary: Denies dysuria, urgency, frequency, hematuria, flank pain and difficulty urinating.  Musculoskeletal: Denies myalgias, back pain, joint swelling, arthralgias and gait problem.  Skin: Denies pallor, rash and wound.  Neurological: Denies dizziness, seizures, syncope, weakness, light-headedness, numbness and headaches.  Hematological: Denies  adenopathy. Easy bruising, personal or family bleeding history  Psychiatric/Behavioral: Denies suicidal ideation, mood changes, confusion, nervousness, sleep disturbance and agitation       Past Medical History  Diagnosis Date  . COPD (chronic obstructive pulmonary disease) (HCC)   . Hypertension   . Transient ischemic attack (TIA)      Past Surgical History  Procedure Laterality Date  . Hemorrohoid surgery        Social History:  reports that he has been smoking.  He does not have any smokeless tobacco history on file. He reports that he does not drink alcohol or use illicit drugs.   No Known Allergies      FAMILY HISTORY  When questioned  Directly-patient reports  No family history of HTN, CVA ,DIABETES, TB, Cancer CAD, Bleeding Disorders, Sickle Cell, diabetes, anemia, asthma,   Prior to Admission medications   Medication Sig Start Date End Date Taking? Authorizing Provider  albuterol (PROVENTIL HFA;VENTOLIN HFA) 108 (90 BASE) MCG/ACT inhaler Inhale 2 puffs into the lungs every 6 (six) hours as needed for wheezing or shortness of breath.   Yes Historical Provider, MD  amLODipine (NORVASC) 10 MG tablet Take 10 mg by mouth daily. 07/10/13  Yes Historical Provider, MD  ASMANEX HFA 100 MCG/ACT AERO Inhale 1 puff into the lungs daily as needed (shortness of breath).  02/03/15  Yes Historical Provider, MD  CRESTOR 20 MG tablet Take 20 mg by mouth at bedtime.  08/19/14  Yes Historical Provider, MD  guaiFENesin (MUCINEX) 600 MG 12 hr tablet Take 600 mg by mouth 2 (two) times daily as needed for cough.   Yes Historical Provider, MD  hydrochlorothiazide (HYDRODIURIL) 25 MG tablet Take 25 mg by mouth daily. 09/06/13  Yes Historical Provider, MD  metoprolol (LOPRESSOR) 50 MG tablet Take 50 mg by  mouth 2 (two) times daily. 07/12/14  Yes Historical Provider, MD  OVER THE COUNTER MEDICATION Take 1 Dose by mouth daily as needed (cough). Food lion cough syrup   Yes Historical Provider, MD   tiotropium (SPIRIVA) 18 MCG inhalation capsule Place 18 mcg into inhaler and inhale daily.   Yes Historical Provider, MD     Physical Exam: Filed Vitals:   04/08/15 1122 04/08/15 1216 04/08/15 1318 04/08/15 1536  BP: 126/72 125/73 137/70 116/70  Pulse: 123 122 111 107  Temp: 98.1 F (36.7 C)     TempSrc: Oral     Resp: 35  SpO2: 100% 95% 93% 92%     Constitutional: Vital signs reviewed. Patient is a well-developed and well-nourished in no acute distress and cooperative with exam. Alert and oriented x3.  Head: Normocephalic and atraumatic  Ear: TM normal bilaterally  Mouth: no erythema or exudates, MMM  Eyes: PERRL, EOMI, conjunctivae normal, No scleral icterus.  Neck: Supple, Trachea midline normal ROM, No JVD, mass, thyromegaly, or carotid bruit present.  Cardiovascular: RRR, S1 normal, S2 normal, no MRG, pulses symmetric and intact bilaterally  Pulmonary/Chest: Accessory muscle usage present. No stridor. No respiratory distress. He has wheezes. He has no rales.  Abdominal: Soft. Non-tender, non-distended, bowel sounds are normal, no masses, organomegaly, or guarding present.  GU: no CVA tenderness Musculoskeletal: No joint deformities, erythema, or stiffness, ROM full and no nontender Ext: no edema and no cyanosis, pulses palpable bilaterally (DP and PT)  Hematology: no cervical, inginal, or axillary adenopathy.  Neurological: A&O x3, Strenght is normal and symmetric bilaterally, cranial nerve II-XII are grossly intact, no focal motor deficit, sensory intact to light touch bilaterally.  Skin: Warm, dry and intact. No rash, cyanosis, or clubbing.  Psychiatric: Normal mood and affect. speech and behavior is normal. Judgment and thought content normal. Cognition and memory are normal.      Data Review   Micro Results No results found for this or any previous visit (from the past 240 hour(s)).  Radiology Reports Dg Chest 2 View  04/08/2015  CLINICAL DATA:   Increasing shortness of breath for 4 days. Cough, COPD EXAM: CHEST  2 VIEW COMPARISON:  08/24/2014 FINDINGS: There is hyperinflation of the lungs compatible with COPD. Heart and mediastinal contours are within normal limits. No focal opacities or effusions. No acute bony abnormality. IMPRESSION: COPD.  No active disease. Electronically Signed   By: Charlett Nose M.D.   On: 04/08/2015 12:19     CBC  Recent Labs Lab 04/08/15 1203  WBC 8.0  HGB 12.5*  HCT 36.9*  PLT 302  MCV 84.6  MCH 28.7  MCHC 33.9  RDW 12.7    Chemistries   Recent Labs Lab 04/08/15 1203  NA 131*  K 2.8*  CL 90*  CO2 26  GLUCOSE 142*  BUN 36*  CREATININE 1.23  CALCIUM 9.1  AST 43*  ALT 30  ALKPHOS 68  BILITOT 1.2   ------------------------------------------------------------------------------------------------------------------ CrCl cannot be calculated (Unknown ideal weight.). ------------------------------------------------------------------------------------------------------------------ No results for input(s): HGBA1C in the last 72 hours. ------------------------------------------------------------------------------------------------------------------ No results for input(s): CHOL, HDL, LDLCALC, TRIG, CHOLHDL, LDLDIRECT in the last 72 hours. ------------------------------------------------------------------------------------------------------------------ No results for input(s): TSH, T4TOTAL, T3FREE, THYROIDAB in the last 72 hours.  Invalid input(s): FREET3 ------------------------------------------------------------------------------------------------------------------ No results for input(s): VITAMINB12, FOLATE, FERRITIN, TIBC, IRON, RETICCTPCT in the last 72 hours.  Coagulation profile No results for input(s): INR, PROTIME in the last 168 hours.  No results for input(s): DDIMER in  the last 72 hours.  Cardiac Enzymes  Recent Labs Lab 04/08/15 1203  TROPONINI <0.03    ------------------------------------------------------------------------------------------------------------------ Invalid input(s): POCBNP   CBG: No results for input(s): GLUCAP in the last 168 hours.     EKG: Independently reviewed.  Date/Time: Tuesday April 08 2015 11:56:44 EST Ventricular Rate: 122 PR Interval: 93 QRS Duration: 101 QT Interval: 349 QTC Calculation: 497 R Axis: 62 Text Interpretation: Sinus tachycardia Consider right atrial enlargement  ST depr, consider ischemia, inferior leads , increased from last tracing  Borderline prolonged QT interval    Assessment/Plan Principal Problem:   Acute COPD exacerbation (HCC) Patient will be admitted to telemetry Start patient on Levaquin, IV Solu-Medrol, nebulizer treatments Patient has no improvement after continuous nebs in the ER Repeat chest x-ray tomorrow to rule out pneumonia after receiving IV fluids Continue Spiriva  Active Problems:   Weight loss, unintentional-patient appears to be wasted secondary to his underlying COPD, no malignancy on chest x-ray, defer further malignancy workup to PCP    Acute hyponatremia-likely secondary to HCTZ, dehydration secondary to poor oral intake for the last 5 days  Dyslipidemia-continue Crestor    Hypokalemia-likely secondary to hydrochlorothiazide, loss of appetite, check magnesium level and replete  Essential hypertension-hold HCTZ and continue with Norvasc and metoprolol     Code Status Orders        Start     Ordered   04/08/15 1551  Full code   Continuous     04/08/15 1552    Code Status History    Date Active Date Inactive Code Status Order ID Comments User Context   This patient has a current code status but no historical code status.      Family Communication: bedside Disposition Plan: admit   Total time spent 55 minutes.Greater than 50% of this time was spent in counseling, explanation of diagnosis, planning of further management,  and coordination of care  [facility] Pager [phone]  If 7PM-7AM, please contact night-coverage [email or web address] Password [facility] 04/08/2015, 3:55 PM

## 2015-04-08 NOTE — ED Provider Notes (Signed)
CSN: [id number]     Arrival date & time 04/08/15  1109 History   First MD Initiated Contact with Patient 04/08/15 1141     Chief Complaint  Patient presents with  . Shortness of Breath    HPI Comments: Initially started with cold and cough type symptoms about 5 days ago.  It has progressed and now he is very short of breath.  Whenever he moves around a little bit or starts coughing he feels very short of breath.  He has been using inhalers without relief.  He has hx of COPD and has home oxygen.  He has been using it more the last few days.  Patient is a 70 y.o. male presenting with shortness of breath. The history is provided by the patient.  Shortness of Breath Severity:  Severe Onset quality:  Gradual Associated symptoms: no chest pain and no fever     Past Medical History  Diagnosis Date  . COPD (chronic obstructive pulmonary disease) (HCC)   . Hypertension   . Transient ischemic attack (TIA)    Past Surgical History  Procedure Laterality Date  . Hemorrohoid surgery     No family history on file. Social History  Substance Use Topics  . Smoking status: Current Every Day Smoker  . Smokeless tobacco: None  . Alcohol Use: No    Review of Systems  Constitutional: Negative for fever.  Respiratory: Positive for shortness of breath.   Cardiovascular: Negative for chest pain.  All other systems reviewed and are negative.     Allergies  Review of patient's allergies indicates no known allergies.  Home Medications   Prior to Admission medications   Medication Sig Start Date End Date Taking? Authorizing Provider  albuterol (PROVENTIL HFA;VENTOLIN HFA) 108 (90 BASE) MCG/ACT inhaler Inhale 2 puffs into the lungs every 6 (six) hours as needed for wheezing or shortness of breath.   Yes Historical Provider, MD  amLODipine (NORVASC) 10 MG tablet Take 10 mg by mouth daily. 07/10/13  Yes Historical Provider, MD  ASMANEX HFA 100 MCG/ACT AERO Inhale 1 puff into the lungs daily as  needed (shortness of breath).  02/03/15  Yes Historical Provider, MD  CRESTOR 20 MG tablet Take 20 mg by mouth at bedtime.  08/19/14  Yes Historical Provider, MD  guaiFENesin (MUCINEX) 600 MG 12 hr tablet Take 600 mg by mouth 2 (two) times daily as needed for cough.   Yes Historical Provider, MD  hydrochlorothiazide (HYDRODIURIL) 25 MG tablet Take 25 mg by mouth daily. 09/06/13  Yes Historical Provider, MD  metoprolol (LOPRESSOR) 50 MG tablet Take 50 mg by mouth 2 (two) times daily. 07/12/14  Yes Historical Provider, MD  OVER THE COUNTER MEDICATION Take 1 Dose by mouth daily as needed (cough). Food lion cough syrup   Yes Historical Provider, MD  tiotropium (SPIRIVA) 18 MCG inhalation capsule Place 18 mcg into inhaler and inhale daily.   Yes Historical Provider, MD   BP 137/70 mmHg  Pulse 111  Temp(Src) 98.1 F (36.7 C) (Oral)  Resp 25  SpO2 93% Physical Exam  Constitutional: He appears well-developed and well-nourished. No distress.  HENT:  Head: Normocephalic and atraumatic.  Right Ear: External ear normal.  Left Ear: External ear normal.  Eyes: Conjunctivae are normal. Right eye exhibits no discharge. Left eye exhibits no discharge. No scleral icterus.  Neck: Neck supple. No tracheal deviation present.  Cardiovascular: Normal rate, regular rhythm and intact distal pulses.   Pulmonary/Chest: Accessory muscle usage present. No stridor. No  respiratory distress. He has wheezes. He has no rales.  Abdominal: Soft. Bowel sounds are normal. He exhibits no distension. There is no tenderness. There is no rebound and no guarding.  Musculoskeletal: He exhibits no edema or tenderness.  Neurological: He is alert. He has normal strength. No cranial nerve deficit (no facial droop, extraocular movements intact, no slurred speech) or sensory deficit. He exhibits normal muscle tone. He displays no seizure activity. Coordination normal.  Skin: Skin is warm and dry. No rash noted.  Psychiatric: He has a normal  mood and affect.  Nursing note and vitals reviewed.   ED Course  Procedures (including critical care time) Labs Review Labs Reviewed  COMPREHENSIVE METABOLIC PANEL - Abnormal; Notable for the following:    Sodium 131 (*)    Potassium 2.8 (*)    Chloride 90 (*)    Glucose, Bld 142 (*)    BUN 36 (*)    Albumin 3.4 (*)    AST 43 (*)    GFR calc non Af Amer 57 (*)    All other components within normal limits  CBC - Abnormal; Notable for the following:    Hemoglobin 12.5 (*)    HCT 36.9 (*)    All other components within normal limits  BRAIN NATRIURETIC PEPTIDE  TROPONIN I    Imaging Review Dg Chest 2 View  04/08/2015  CLINICAL DATA:  Increasing shortness of breath for 4 days. Cough, COPD EXAM: CHEST  2 VIEW COMPARISON:  08/24/2014 FINDINGS: There is hyperinflation of the lungs compatible with COPD. Heart and mediastinal contours are within normal limits. No focal opacities or effusions. No acute bony abnormality. IMPRESSION: COPD.  No active disease. Electronically Signed   By: Charlett Nose M.D.   On: 04/08/2015 12:19   I have personally reviewed and evaluated these images and lab results as part of my medical decision-making.   EKG Interpretation   Date/Time:  Tuesday April 08 2015 11:56:44 EST Ventricular Rate:  122 PR Interval:  93 QRS Duration: 101 QT Interval:  349 QTC Calculation: 497 R Axis:   62 Text Interpretation:  Sinus tachycardia Consider right atrial enlargement  ST depr, consider ischemia, inferior leads , increased from last tracing  Borderline prolonged QT interval Confirmed by Afshin Chrystal  MD-J, Otelia Hettinger ([id number]) on  04/08/2015 12:53:27 PM     Medications  albuterol (PROVENTIL) (2.5 MG/3ML) 0.083% nebulizer solution 5 mg (5 mg Nebulization Given 04/08/15 1434)  potassium chloride SA (K-DUR,KLOR-CON) CR tablet 40 mEq (40 mEq Oral Given 04/08/15 1345)    MDM  1327  Breathing better but still wheezing and tachypneic. 1524  Still wheezing on exam.  Tachypnea but  more comfortable  Final diagnoses:  COPD exacerbation (HCC)  Hypokalemia   Patient presents to emergency room with complaints of shortness of breath. Symptoms are suggestive of a COPD exacerbation. Laboratory tests were remarkable for hypokalemia likely related to the albuterol nebulizer treatments. No evidence of cardiac ischemia or pulmonary edema. Chest x-ray without pneumonia. Patient had persistent wheezing despite treatment in the ED. Plan admission for further treatment and evaluation.     Linwood Dibbles, MD 04/08/15 [phone]

## 2015-04-08 NOTE — Progress Notes (Signed)
EDCM spoke to patient at bedside.  Patient reports his pcp is Dr. Royal Hawthorn on [address].  Patient with Medicare and Medicaid insurance.  Pcp listed on Medicaid card is located at the [facility].  Patient reports he is supposed to wear oxygen at home, but "I just can't do it."  Patient reports he wear his oxygen when he needs it.  Patient cannot remember who provides his oxygen.  Patient states, "My niece takes care of that."  No further EDCM needs at this time.

## 2015-04-08 NOTE — ED Notes (Signed)
PT CAN GO TO FLOOR AT 2126 

## 2015-04-08 NOTE — ED Notes (Signed)
Pt stated he is unable to give a urine sample. Urinal at bedside.

## 2015-04-08 NOTE — ED Notes (Signed)
Bed: WA01 Expected date:  Expected time:  Means of arrival:  Comments: Hold Triage

## 2015-04-08 NOTE — ED Notes (Signed)
Per EMS-states has been sick for 5 days-SOB getting worse over the last day-deuo neb (albuterol 10 mg) given in route-18 g in left forearm-125 mg of solumedrol given

## 2015-04-09 ENCOUNTER — Inpatient Hospital Stay (HOSPITAL_COMMUNITY): Payer: Medicare Other

## 2015-04-09 DIAGNOSIS — E871 Hypo-osmolality and hyponatremia: Secondary | ICD-10-CM

## 2015-04-09 DIAGNOSIS — J45901 Unspecified asthma with (acute) exacerbation: Secondary | ICD-10-CM

## 2015-04-09 DIAGNOSIS — E43 Unspecified severe protein-calorie malnutrition: Secondary | ICD-10-CM | POA: Insufficient documentation

## 2015-04-09 DIAGNOSIS — J441 Chronic obstructive pulmonary disease with (acute) exacerbation: Principal | ICD-10-CM

## 2015-04-09 LAB — CBC
HCT: 33.8 % — ABNORMAL LOW (ref 39.0–52.0)
Hemoglobin: 11.5 g/dL — ABNORMAL LOW (ref 13.0–17.0)
MCH: 29 pg (ref 26.0–34.0)
MCHC: 34 g/dL (ref 30.0–36.0)
MCV: 85.4 fL (ref 78.0–100.0)
PLATELETS: 334 10*3/uL (ref 150–400)
RBC: 3.96 MIL/uL — AB (ref 4.22–5.81)
RDW: 12.9 % (ref 11.5–15.5)
WBC: 10.1 10*3/uL (ref 4.0–10.5)

## 2015-04-09 LAB — COMPREHENSIVE METABOLIC PANEL
ALK PHOS: 66 U/L (ref 38–126)
ALT: 30 U/L (ref 17–63)
ANION GAP: 10 (ref 5–15)
AST: 37 U/L (ref 15–41)
Albumin: 3.1 g/dL — ABNORMAL LOW (ref 3.5–5.0)
BILIRUBIN TOTAL: 0.7 mg/dL (ref 0.3–1.2)
BUN: 35 mg/dL — ABNORMAL HIGH (ref 6–20)
CALCIUM: 9.1 mg/dL (ref 8.9–10.3)
CO2: 25 mmol/L (ref 22–32)
CREATININE: 1.1 mg/dL (ref 0.61–1.24)
Chloride: 98 mmol/L — ABNORMAL LOW (ref 101–111)
Glucose, Bld: 133 mg/dL — ABNORMAL HIGH (ref 65–99)
Potassium: 4 mmol/L (ref 3.5–5.1)
SODIUM: 133 mmol/L — AB (ref 135–145)
TOTAL PROTEIN: 6.5 g/dL (ref 6.5–8.1)

## 2015-04-09 LAB — URINALYSIS, ROUTINE W REFLEX MICROSCOPIC
BILIRUBIN URINE: NEGATIVE
GLUCOSE, UA: NEGATIVE mg/dL
HGB URINE DIPSTICK: NEGATIVE
Ketones, ur: NEGATIVE mg/dL
LEUKOCYTES UA: NEGATIVE
NITRITE: NEGATIVE
PH: 6 (ref 5.0–8.0)
Protein, ur: NEGATIVE mg/dL
Specific Gravity, Urine: 1.019 (ref 1.005–1.030)

## 2015-04-09 LAB — TROPONIN I

## 2015-04-09 LAB — INFLUENZA PANEL BY PCR (TYPE A & B)
H1N1 flu by pcr: NOT DETECTED
Influenza A By PCR: NEGATIVE
Influenza B By PCR: NEGATIVE

## 2015-04-09 MED ORDER — BOOST / RESOURCE BREEZE PO LIQD
1.0000 | Freq: Three times a day (TID) | ORAL | Status: DC
  Administered 2015-04-09 – 2015-04-11 (×5): 1 via ORAL

## 2015-04-09 MED ORDER — ADULT MULTIVITAMIN LIQUID CH
5.0000 mL | ORAL | Status: DC
  Administered 2015-04-09 – 2015-04-10 (×2): 5 mL via ORAL
  Filled 2015-04-09 (×3): qty 5

## 2015-04-09 MED ORDER — IPRATROPIUM-ALBUTEROL 0.5-2.5 (3) MG/3ML IN SOLN
3.0000 mL | Freq: Three times a day (TID) | RESPIRATORY_TRACT | Status: DC
  Administered 2015-04-09 – 2015-04-11 (×7): 3 mL via RESPIRATORY_TRACT
  Filled 2015-04-09 (×7): qty 3

## 2015-04-09 MED ORDER — LEVALBUTEROL HCL 0.63 MG/3ML IN NEBU: 0.6300 mg | INHALATION_SOLUTION | Freq: Four times a day (QID) | RESPIRATORY_TRACT | Status: DC

## 2015-04-09 MED ORDER — ALBUTEROL SULFATE (2.5 MG/3ML) 0.083% IN NEBU
2.5000 mg | INHALATION_SOLUTION | Freq: Four times a day (QID) | RESPIRATORY_TRACT | Status: DC | PRN
  Administered 2015-04-10: 2.5 mg via RESPIRATORY_TRACT
  Filled 2015-04-09: qty 3

## 2015-04-09 MED ORDER — BUDESONIDE 0.25 MG/2ML IN SUSP
0.2500 mg | Freq: Two times a day (BID) | RESPIRATORY_TRACT | Status: DC
  Administered 2015-04-09 – 2015-04-11 (×5): 0.25 mg via RESPIRATORY_TRACT
  Filled 2015-04-09 (×5): qty 2

## 2015-04-09 NOTE — Progress Notes (Signed)
Occupational Therapy Evaluation Patient Details Name: Spencer Banks MRN: [id number] DOB: 1945-03-24 Today's Date: 04/09/2015    History of Present Illness 70 yo COPD exac. Hx of COPD, HTN, TIA   Clinical Impression   Patient presents to OT with decreased ADL independence and safety due to the deficits listed below. Currently min A with ADLs, normally modified independent/independent. Currently fatigues with minimal activity. OT will follow.    Follow Up Recommendations  Home health OT;Supervision/Assistance - 24 hour    Equipment Recommendations  Other (comment) (tbd)    Recommendations for Other Services       Precautions / Restrictions Precautions Precautions: Fall Precaution Comments: monitor sats Restrictions Weight Bearing Restrictions: No      Mobility Bed Mobility Overal bed mobility: Modified Independent             General bed mobility comments: HOB elevated  Transfers Overall transfer level: Needs assistance Equipment used: None Transfers: Sit to/from Stand Sit to Stand: Min assist         General transfer comment: assist to steady.     Balance                                         ADL Overall ADL's : Needs assistance/impaired Eating/Feeding: Independent;Bed level;Sitting   Grooming: Set up;Sitting   Upper Body Bathing: Set up;Sitting   Lower Body Bathing: Minimal assistance;Sit to/from stand   Upper Body Dressing : Set up;Sitting   Lower Body Dressing: Minimal assistance;Sit to/from stand   Toilet Transfer: Cueing for safety;Ambulation;Minimal assistance   Toileting- Architect and Hygiene: Min guard;Sit to/from stand       Functional mobility during ADLs: Minimal assistance General ADL Comments: Patient gets SOB with minimal activity. Observed him adjusting socks; donned pants sit to stand. Ambulation in room and hall. Mildly unsteady upon standing and in tight spaces, got better with arm support  from IV pole. Patient's O2 sats initially 92% on room air, HR 100 bpm. With ambulation, O2 sats dropped to 84% and HR in 30s-40s. Patient returned to bed, O2 reapplied, sats back up to 92%-94% on 2LO2 via [place].      Vision     Perception     Praxis      Pertinent Vitals/Pain Pain Assessment: No/denies pain     Hand Dominance Right   Extremity/Trunk Assessment Upper Extremity Assessment Upper Extremity Assessment: Overall WFL for tasks assessed   Lower Extremity Assessment Lower Extremity Assessment: Defer to PT evaluation   Cervical / Trunk Assessment Cervical / Trunk Assessment: Normal   Communication Communication Communication: No difficulties   Cognition Arousal/Alertness: Awake/alert Behavior During Therapy: WFL for tasks assessed/performed Overall Cognitive Status: Within Functional Limits for tasks assessed                     General Comments       Exercises       Shoulder Instructions      Home Living Family/patient expects to be discharged to:: Private residence Living Arrangements: Other relatives;Other (Comment) (has a camper that he stays in; also stays at sister's home) Available Help at Discharge: Family Type of Home: House Home Access: Stairs to enter [employer] of Steps: 2   Home Layout: One level     Bathroom Shower/Tub: Chief Strategy Officer: Standard     Home Equipment: Other (comment) (home O2)  Additional Comments: has O2 that he wears "as needed"      Prior Functioning/Environment Level of Independence: Independent             OT Diagnosis: Generalized weakness   OT Problem List: Decreased activity tolerance;Impaired balance (sitting and/or standing);Decreased knowledge of use of DME or AE;Decreased safety awareness;Cardiopulmonary status limiting activity   OT Treatment/Interventions: Self-care/ADL training;Energy conservation;DME and/or AE instruction;Therapeutic  activities;Patient/family education    OT Goals(Current goals can be found in the care plan section) Acute Rehab OT Goals Patient Stated Goal: home.  OT Goal Formulation: With patient Time For Goal Achievement: 04/23/15 Potential to Achieve Goals: Good  OT Frequency: Min 2X/week   Barriers to D/C:            Co-evaluation PT/OT/SLP Co-Evaluation/Treatment: Yes Reason for Co-Treatment: Complexity of the patient's impairments (multi-system involvement) PT goals addressed during session: Mobility/safety with mobility OT goals addressed during session: ADL's and self-care      End of Session Equipment Utilized During Treatment: Oxygen Nurse Communication: Mobility status  Activity Tolerance: Patient tolerated treatment well Patient left: in bed;with call bell/phone within reach;with bed alarm set   Time: 1610-9604 OT Time Calculation (min): 24 min Charges:  OT General Charges $OT Visit: 1 Procedure OT Evaluation $OT Eval Moderate Complexity: 1 Procedure G-Codes:    Genevie Elman A April 19, 2015, 1:08 PM

## 2015-04-09 NOTE — Progress Notes (Signed)
Initial Nutrition Assessment  DOCUMENTATION CODES:   Severe malnutrition in context of chronic illness  INTERVENTION:  - Will order Boost Breeze po TID, each supplement provides 250 kcal and 9 grams of protein - Will order daily multivitamin  - RD will continue to monitor for needs  NUTRITION DIAGNOSIS:   Inadequate oral intake related to poor appetite as evidenced by per patient/family report.  GOAL:   Patient will meet greater than or equal to 90% of their needs  MONITOR:   PO intake, Supplement acceptance, Weight trends, Labs, I & O's  REASON FOR ASSESSMENT:   Consult Assessment of nutrition requirement/status  ASSESSMENT:   70 year old male with a history of hypertension, smoking, presents to the ER with a five-day history of cough, progressively getting worse associated with shortness of breath, worsening dyspnea on exertion. Ambulation is limited by coughing spells and weakness. Patient admits to smoking a pack of cigarettes a day but has not smoked in 5 days. Patient is currently on home oxygen, 2 L at night. He had an appointment to be seen by his PCP this week for worsening dyspnea. No relief at home from albuterol inhaler. Patient received nebulizer treatments and seems to be mildly improved. In the ER found to have COPD exacerbation, chest x-ray no pneumonia, no fever, tachycardic in the 120s, requiring 2 L of oxygen. Patient being admitted for COPD exacerbation  Pt seen for consult. BMI indicates normal weight. No intakes documented. Pt reports he ate scrambled eggs and drank coffee for breakfast this AM. He denies abdominal pain or nausea with or without PO intakes now or PTA. Pt states that for the past 1 week he has had increased SOB and coughing with movement. He cannot recall if these symptoms are exacerbated by eating and drinking. Pt states that during the past 1 week he has had very poor appetite and was only able to take a few bites/day. Prior to this he does not  feel that he had a poor appetite.  Pt was not drinking nutrition supplement shakes or taking nutrition supplements PTA. He states he was mainly drinking coffee and Pepsi and feels he does not drink enough water. Pt states UBW of 150 lbs but states that it was at least a few years ago. He states more recent UBW of 145 lbs. Per chart review, pt has lost 11 lbs (8.9% body weight) in the past 7 months which is not significant for time frame. Severe muscle and fat wasting noted to upper and lower body.   Pt not meeting needs. Will place orders as outlined above. Medications reviewed. IVF: NS-40 mEq KCl @ 100 mL/hr. Labs reviewed; Na: 133 mmol/L, Cl: 98 mmol/L, BUN: 35 mg/dL.   Diet Order:  Diet Heart Room service appropriate?: Yes; Fluid consistency:: Thin  Skin:  Reviewed, no issues  Last BM:  2/21  Height:   Ht Readings from Last 1 Encounters:  04/08/15  (1.727 m)    Weight:   Wt Readings from Last 1 Encounters:  04/08/15 124 lb 9 oz (56.5 kg)    Ideal Body Weight:  70 kg (kg)  BMI:  Body mass index is 18.94 kg/(m^2).  Estimated Nutritional Needs:   Kcal:  1400-1600  Protein:  70-80 grams  Fluid:  1.7-2 L/day  EDUCATION NEEDS:   No education needs identified at this time     Trenton Gammon, RD, LDN Inpatient Clinical Dietitian Pager # [phone] After hours/weekend pager # [phone]

## 2015-04-09 NOTE — Evaluation (Signed)
Physical Therapy Evaluation Patient Details Name: Omarian Jaquith MRN: [id number] DOB: 01-Feb-1946 Today's Date: 04/09/2015   History of Present Illness  70 yo CODP exac. Hx of COPD, HTN, TIA  Clinical Impression  On eval, pt required Min assist for mobility-walked ~75 feet while holding on to IV pole for support. Very unsteady initially. Fatigues quickly with activity. Dyspnea 3/4 with ambulation. O2 sats 92% RA at rest, 84% on RA during ambulation.     Follow Up Recommendations Home health PT vs No PT follow up (depending on progress)    Equipment Recommendations  None recommended by PT    Recommendations for Other Services OT consult     Precautions / Restrictions Precautions Precautions: Fall Precaution Comments: monitor sats Restrictions Weight Bearing Restrictions: No      Mobility  Bed Mobility Overal bed mobility: Modified Independent             General bed mobility comments: HOB elevated  Transfers Overall transfer level: Needs assistance   Transfers: Sit to/from Stand Sit to Stand: Min assist         General transfer comment: assist to steady.   Ambulation/Gait Ambulation/Gait assistance: Min assist Ambulation Distance (Feet): 75 Feet Assistive device:  (IV pole) Gait Pattern/deviations: Step-through pattern;Decreased stride length     General Gait Details: very unsteady during 1st 15-20 feet. O2 sats dropped to 84% on RA while ambulating, dyspnea 3/4. Intermittent coughing noted.  Stairs            Wheelchair Mobility    Modified Rankin (Stroke Patients Only)       Balance Overall balance assessment: Needs assistance         Standing balance support: No upper extremity supported;During functional activity Standing balance-Leahy Scale: Fair                               Pertinent Vitals/Pain Pain Assessment: No/denies pain    Home Living Family/patient expects to be discharged to:: Private residence Living  Arrangements: Other relatives (has a camper that he stays in sometimes. has been at sister's home most recently) Available Help at Discharge: Family Type of Home: House (sister's home) Home Access: Stairs to enter   Secretary/administrator of Steps: 2 Home Layout: One level Home Equipment:  (home O2) Additional Comments: has O2 that he wears "as needed"    Prior Function Level of Independence: Independent               Hand Dominance   Dominant Hand: Right    Extremity/Trunk Assessment   Upper Extremity Assessment: Defer to OT evaluation           Lower Extremity Assessment: Generalized weakness      Cervical / Trunk Assessment: Normal  Communication   Communication: No difficulties  Cognition Arousal/Alertness: Awake/alert Behavior During Therapy: WFL for tasks assessed/performed Overall Cognitive Status: Within Functional Limits for tasks assessed                      General Comments      Exercises        Assessment/Plan    PT Assessment Patient needs continued PT services  PT Diagnosis Difficulty walking;Generalized weakness   PT Problem List Decreased balance;Decreased mobility;Decreased activity tolerance;Cardiopulmonary status limiting activity  PT Treatment Interventions Gait training;Functional mobility training;Therapeutic activities;Patient/family education;Balance training;Therapeutic exercise   PT Goals (Current goals can be found in the Care Plan section) Acute  Rehab PT Goals Patient Stated Goal: home.  PT Goal Formulation: With patient Time For Goal Achievement: 04/23/15 Potential to Achieve Goals: Good    Frequency Min 3X/week   Barriers to discharge        Co-evaluation               End of Session   Activity Tolerance: Patient limited by fatigue Patient left: in bed;with call bell/phone within reach;with bed alarm set           Time: 1610-9604 PT Time Calculation (min) (ACUTE ONLY): 23 min   Charges:    PT Evaluation $PT Eval Low Complexity: 1 Procedure     PT G Codes:        Rebeca Alert, MPT Pager: [phone]

## 2015-04-09 NOTE — Progress Notes (Signed)
Patient ID: Spencer Banks, male   DOB: 1946-02-01, 70 y.o.   MRN: [id number]  [facility] PROGRESS NOTE  Spencer Banks WUJ:[id number] DOB: 04/30/1945 DOA: 04/08/2015 PCP: Pcp Not In System   Brief narrative:    70 year old male with a history of hypertension, smoking, presented to the ER with a five-day history of  Progressively worsening productive cough of clear and yellow sputum associated with exertional dyspnea as well as dyspnea at rest, malaise, poor oral intake.   In the ER, pt found to have COPD exacerbation, chest x-ray no pneumonia, no fever, tachycardic in the 120s, requiring 2 L of oxygen.  Assessment/Plan:    Principal Problem:   COPD exacerbation (HCC) - with likely some bronchitis - agree with continuing Levaquin day #2 - follow up on CXR this AM - follow up on sputum cultures - continue BD's scheduled and as needed - continue solumedrol for now - follow up on resp viral panel   Active Problems:   Acute hyponatremia - mild, monitor - BMP in AM    Hypokalemia - supplemented and WNL this AM  DVT prophylaxis - Lovenox SQ  Code Status: Full.  Family Communication:  plan of care discussed with the patient Disposition Plan: Home by 2/25  IV access:  Peripheral IV  Procedures and diagnostic studies:    X-ray Chest Pa And Lateral  04/09/2015  CLINICAL DATA:  Cough, shortness of breath, COPD EXAM: CHEST  2 VIEW COMPARISON:  04/08/2015 FINDINGS: Cardiomediastinal silhouette is stable. Hyperinflation again noted. Stable chronic mild interstitial prominence. No definite superimposed infiltrate or pulmonary edema. IMPRESSION: No active disease. Stable hyperinflation and chronic mild interstitial prominence. Electronically Signed   By: Natasha Mead M.D.   On: 04/09/2015 09:44   Dg Chest 2 View  04/08/2015  CLINICAL DATA:  Increasing shortness of breath for 4 days. Cough, COPD EXAM: CHEST  2 VIEW COMPARISON:  08/24/2014 FINDINGS: There is hyperinflation of the  lungs compatible with COPD. Heart and mediastinal contours are within normal limits. No focal opacities or effusions. No acute bony abnormality. IMPRESSION: COPD.  No active disease. Electronically Signed   By: Charlett Nose M.D.   On: 04/08/2015 12:19    Medical Consultants:  None  Other Consultants:  None  IAnti-Infectives:   Levaquin 2/21 -->  Debbora Presto, MD  [facility] Pager [phone]  If 7PM-7AM, please contact night-coverage [email or web address] Password [facility] 04/09/2015, 4:37 PM   LOS: 1 day   HPI/Subjective: No events overnight.   Objective: Filed Vitals:   04/09/15 0530 04/09/15 0940 04/09/15 1051 04/09/15 1541  BP: 123/73  133/70 122/83  Pulse: 81  99 78  Temp: 97.7 F (36.5 C)   97.6 F (36.4 C)  TempSrc: Oral   Oral  Resp: Height:      Weight:      SpO2: 98% 97% 95% 98%    Intake/Output Summary (Last 24 hours) at 04/09/15 1637 Last data filed at 04/09/15 1500  Gross per 24 hour  Intake 2658.33 ml  Output    300 ml  Net 2358.33 ml    Exam:   General:  Pt is alert, follows commands appropriately, not in acute distress  Cardiovascular: Regular rate and rhythm, S1/S2, no rubs, no gallops  Respiratory: Course breath sounds bilaterally with exp wheezing and rales at bases   Abdomen: Soft, non tender, non distended, bowel sounds present, no guarding  Extremities: No edema, pulses DP and PT palpable bilaterally  Neuro: Grossly nonfocal  Data Reviewed: Basic Metabolic Panel:  Recent Labs Lab 04/08/15 1203 04/08/15 1611 04/09/15 0506  NA 131*  --  133*  K 2.8*  --  4.0  CL 90*  --  98*  CO2 26  --  25  GLUCOSE 142*  --  133*  BUN 36*  --  35*  CREATININE 1.23  --  1.10  CALCIUM 9.1  --  9.1  MG  --  2.4  --    Liver Function Tests:  Recent Labs Lab 04/08/15 1203 04/09/15 0506  AST 43* 37  ALT 30 30  ALKPHOS 68 66  BILITOT 1.2 0.7  PROT 7.2 6.5  ALBUMIN 3.4* 3.1*   CBC:  Recent Labs Lab 04/08/15 1203 04/09/15 0506   WBC 8.0 10.1  HGB 12.5* 11.5*  HCT 36.9* 33.8*  MCV 84.6 85.4  PLT 302 334   Cardiac Enzymes:  Recent Labs Lab 04/08/15 1203 04/08/15 1611 04/08/15 2259 04/09/15 0506  TROPONINI <0.03 <0.03 <0.03 <0.03   Scheduled Meds: . amLODipine  10 mg Oral Daily  . budesonide (PULMICORT) nebulizer solution  0.25 mg Nebulization BID  . docusate sodium  100 mg Oral BID  . enoxaparin (LOVENOX) injection  40 mg Subcutaneous Q24H  . feeding supplement  1 Container Oral TID BM  . ipratropium-albuterol  3 mL Nebulization TID  . levofloxacin  500 mg Oral Daily  . methylPREDNISolone (SOLU-MEDROL) injection  80 mg Intravenous Q12H  . metoprolol  50 mg Oral BID  . multivitamin  5 mL Oral Q24H  . pantoprazole  40 mg Oral Daily  . rosuvastatin  20 mg Oral QHS  . sodium chloride flush  3 mL Intravenous Q12H   Continuous Infusions: . 0.9 % NaCl with KCl 40 mEq / L 100 mL/hr (04/09/15 1348)

## 2015-04-10 LAB — CBC
HCT: 34.6 % — ABNORMAL LOW (ref 39.0–52.0)
Hemoglobin: 11.7 g/dL — ABNORMAL LOW (ref 13.0–17.0)
MCH: 29.5 pg (ref 26.0–34.0)
MCHC: 33.8 g/dL (ref 30.0–36.0)
MCV: 87.2 fL (ref 78.0–100.0)
PLATELETS: 389 10*3/uL (ref 150–400)
RBC: 3.97 MIL/uL — ABNORMAL LOW (ref 4.22–5.81)
RDW: 13.2 % (ref 11.5–15.5)
WBC: 15.3 10*3/uL — AB (ref 4.0–10.5)

## 2015-04-10 LAB — BASIC METABOLIC PANEL
Anion gap: 6 (ref 5–15)
BUN: 32 mg/dL — AB (ref 6–20)
CALCIUM: 9.3 mg/dL (ref 8.9–10.3)
CO2: 27 mmol/L (ref 22–32)
CREATININE: 0.94 mg/dL (ref 0.61–1.24)
Chloride: 103 mmol/L (ref 101–111)
GFR calc Af Amer: >60 mL/min (ref >60)
GLUCOSE: 122 mg/dL — AB (ref 65–99)
Potassium: 4.9 mmol/L (ref 3.5–5.1)
Sodium: 136 mmol/L (ref 135–145)

## 2015-04-10 LAB — URINE CULTURE: Culture: NO GROWTH

## 2015-04-10 NOTE — Progress Notes (Signed)
Patient ID: Spencer Banks, male   DOB: 23-May-1945, 70 y.o.   MRN: [id number]  [facility] PROGRESS NOTE  Spencer Banks WUJ:[id number] DOB: [birth year] DOA: 04/08/2015 PCP: Pcp Not In System   Brief narrative:    70 year old male with a history of hypertension, smoking, presented to the ER with a five-day history of  Progressively worsening productive cough of clear and yellow sputum associated with exertional dyspnea as well as dyspnea at rest, malaise, poor oral intake.   In the ER, pt found to have COPD exacerbation, chest x-ray no pneumonia, no fever, tachycardic in the 120s, requiring 2 L of oxygen.  Assessment/Plan:    Principal Problem:   COPD exacerbation (HCC) - with likely some bronchitis - agree with continuing Levaquin day #3 - follow up on sputum cultures - continue BD's scheduled and as needed - continue solumedrol for now and plan on tapering down this afternoon  - follow up on resp viral panel   Active Problems:   Acute hyponatremia - mild, resolved  - BMP in AM    Hypokalemia - supplemented and WNL this AM  DVT prophylaxis - Lovenox SQ  Code Status: Full.  Family Communication:  plan of care discussed with the patient Disposition Plan: Home by 2/25  IV access:  Peripheral IV  Procedures and diagnostic studies:    X-ray Chest Pa And Lateral  04/09/2015  CLINICAL DATA:  Cough, shortness of breath, COPD EXAM: CHEST  2 VIEW COMPARISON:  04/08/2015 FINDINGS: Cardiomediastinal silhouette is stable. Hyperinflation again noted. Stable chronic mild interstitial prominence. No definite superimposed infiltrate or pulmonary edema. IMPRESSION: No active disease. Stable hyperinflation and chronic mild interstitial prominence. Electronically Signed   By: Natasha Mead M.D.   On: 04/09/2015 09:44   Dg Chest 2 View  04/08/2015  CLINICAL DATA:  Increasing shortness of breath for 4 days. Cough, COPD EXAM: CHEST  2 VIEW COMPARISON:  08/24/2014 FINDINGS: There is  hyperinflation of the lungs compatible with COPD. Heart and mediastinal contours are within normal limits. No focal opacities or effusions. No acute bony abnormality. IMPRESSION: COPD.  No active disease. Electronically Signed   By: Charlett Nose M.D.   On: 04/08/2015 12:19    Medical Consultants:  None  Other Consultants:  None  IAnti-Infectives:   Levaquin 2/21 -->  Debbora Presto, MD  [facility] Pager [phone]  If 7PM-7AM, please contact night-coverage [email or web address] Password [email or web address] 04/10/2015, 1:24 PM   LOS: 2 days   HPI/Subjective: No events overnight. Still with dyspnea with minimal exertion.  Objective: Filed Vitals:   04/09/15 2011 04/10/15 0540 04/10/15 0839 04/10/15 0918  BP: 119/60 78/53  150/67  Pulse: 85 76  103  Temp: 97.5 F (36.4 C) 97.5 F (36.4 C)    TempSrc: Oral Oral    Resp: 22 24    Height:      Weight:      SpO2: 95% 99% 97%     Intake/Output Summary (Last 24 hours) at 04/10/15 1324 Last data filed at 04/10/15 8657  Gross per 24 hour  Intake   2020 ml  Output    850 ml  Net   1170 ml    Exam:   General:  Pt is alert, follows commands appropriately, not in acute distress  Cardiovascular: Regular rate and rhythm, S1/S2, no rubs, no gallops  Respiratory: Course breath sounds bilaterally with exp wheezing and rales at bases still present   Abdomen: Soft, non tender, non distended, bowel sounds present, no guarding  Extremities:  No edema, pulses DP and PT palpable bilaterally  Neuro: Grossly nonfocal  Data Reviewed: Basic Metabolic Panel:  Recent Labs Lab 04/08/15 1203 04/08/15 1611 04/09/15 0506 04/10/15 0443  NA 131*  --  133* 136  K 2.8*  --  4.0 4.9  CL 90*  --  98* 103  CO2 26  --  25 27  GLUCOSE 142*  --  133* 122*  BUN 36*  --  35* 32*  CREATININE 1.23  --  1.10 0.94  CALCIUM 9.1  --  9.1 9.3  MG  --  2.4  --   --    Liver Function Tests:  Recent Labs Lab 04/08/15 1203 04/09/15 0506  AST 43* 37  ALT 30 30   ALKPHOS 68 66  BILITOT 1.2 0.7  PROT 7.2 6.5  ALBUMIN 3.4* 3.1*   CBC:  Recent Labs Lab 04/08/15 1203 04/09/15 0506 04/10/15 0443  WBC 8.0 10.1 15.3*  HGB 12.5* 11.5* 11.7*  HCT 36.9* 33.8* 34.6*  MCV 84.6 85.4 87.2  PLT 302 334 389   Cardiac Enzymes:  Recent Labs Lab 04/08/15 1203 04/08/15 1611 04/08/15 2259 04/09/15 0506  TROPONINI <0.03 <0.03 <0.03 <0.03   Scheduled Meds: . amLODipine  10 mg Oral Daily  . budesonide (PULMICORT) nebulizer solution  0.25 mg Nebulization BID  . docusate sodium  100 mg Oral BID  . enoxaparin (LOVENOX) injection  40 mg Subcutaneous Q24H  . feeding supplement  1 Container Oral TID BM  . ipratropium-albuterol  3 mL Nebulization TID  . levofloxacin  500 mg Oral Daily  . methylPREDNISolone (SOLU-MEDROL) injection  80 mg Intravenous Q12H  . metoprolol  50 mg Oral BID  . multivitamin  5 mL Oral Q24H  . pantoprazole  40 mg Oral Daily  . rosuvastatin  20 mg Oral QHS  . sodium chloride flush  3 mL Intravenous Q12H   Continuous Infusions:

## 2015-04-10 NOTE — Care Management Important Message (Signed)
Important Message  Patient Details  Name: Isac Lincks MRN: [id number] Date of Birth: 1946/02/09   Medicare Important Message Given:  Yes    Haskell Flirt 04/10/2015, 2:55 PMImportant Message  Patient Details  Name: Acy Orsak MRN: [id number] Date of Birth: 07/25/1945   Medicare Important Message Given:  Yes    Haskell Flirt 04/10/2015, 2:55 PM

## 2015-04-11 LAB — CBC
HCT: 36 % — ABNORMAL LOW (ref 39.0–52.0)
HEMOGLOBIN: 12.2 g/dL — AB (ref 13.0–17.0)
MCH: 29.6 pg (ref 26.0–34.0)
MCHC: 33.9 g/dL (ref 30.0–36.0)
MCV: 87.4 fL (ref 78.0–100.0)
PLATELETS: 409 10*3/uL — AB (ref 150–400)
RBC: 4.12 MIL/uL — AB (ref 4.22–5.81)
RDW: 13.2 % (ref 11.5–15.5)
WBC: 13.8 10*3/uL — AB (ref 4.0–10.5)

## 2015-04-11 LAB — BASIC METABOLIC PANEL
ANION GAP: 9 (ref 5–15)
BUN: 27 mg/dL — ABNORMAL HIGH (ref 6–20)
CO2: 28 mmol/L (ref 22–32)
Calcium: 9.3 mg/dL (ref 8.9–10.3)
Chloride: 99 mmol/L — ABNORMAL LOW (ref 101–111)
Creatinine, Ser: 0.91 mg/dL (ref 0.61–1.24)
GFR calc Af Amer: >60 mL/min (ref >60)
Glucose, Bld: 120 mg/dL — ABNORMAL HIGH (ref 65–99)
POTASSIUM: 4.6 mmol/L (ref 3.5–5.1)
SODIUM: 136 mmol/L (ref 135–145)

## 2015-04-11 MED ORDER — ALBUTEROL SULFATE HFA 108 (90 BASE) MCG/ACT IN AERS: 2.0000 | INHALATION_SPRAY | Freq: Four times a day (QID) | RESPIRATORY_TRACT | Status: AC | PRN

## 2015-04-11 MED ORDER — PREDNISONE 10 MG PO TABS: ORAL_TABLET | ORAL | Status: DC

## 2015-04-11 MED ORDER — BUDESONIDE 0.25 MG/2ML IN SUSP: 0.2500 mg | Freq: Two times a day (BID) | RESPIRATORY_TRACT | Status: AC

## 2015-04-11 MED ORDER — LEVOFLOXACIN 500 MG PO TABS: 500.0000 mg | ORAL_TABLET | Freq: Every day | ORAL | Status: DC

## 2015-04-11 MED ORDER — ALBUTEROL SULFATE (2.5 MG/3ML) 0.083% IN NEBU: 2.5000 mg | INHALATION_SOLUTION | Freq: Four times a day (QID) | RESPIRATORY_TRACT | Status: AC | PRN

## 2015-04-11 MED ORDER — OXYCODONE HCL 5 MG PO TABS: 5.0000 mg | ORAL_TABLET | ORAL | Status: DC | PRN

## 2015-04-11 NOTE — Discharge Summary (Signed)
Physician Discharge Summary  Spencer Banks ZOX:[id number] DOB: 01/08/1946 DOA: 04/08/2015  PCP: Pcp Not In System  Admit date: 04/08/2015 Discharge date: 04/11/2015  Recommendations for Outpatient Follow-up:  1. Pt will need to follow up with PCP in 2-3 weeks post discharge 2. Please obtain BMP to evaluate electrolytes and kidney function 3. Please also check CBC to evaluate Hg and Hct levels 4. Pt advised to complete Levaquin therapy upon discharge   Discharge Diagnoses:  Principal Problem:   COPD exacerbation (HCC) Active Problems:   Weight loss, unintentional   Acute hyponatremia   Hypokalemia   Acute exacerbation of COPD with asthma (HCC)   Protein-calorie malnutrition, severe  Discharge Condition: Stable  Diet recommendation: Heart healthy diet discussed in details    Brief narrative:    70 year old male with a history of hypertension, smoking, presented to the ER with a five-day history of Progressively worsening productive cough of clear and yellow sputum associated with exertional dyspnea as well as dyspnea at rest, malaise, poor oral intake.   In the ER, pt found to have COPD exacerbation, chest x-ray no pneumonia, no fever, tachycardic in the 120s, requiring 2 L of oxygen.  Assessment/Plan:    Principal Problem:  COPD exacerbation (HCC) - with likely some bronchitis and viral panel positive for metapneumovirus  - continue Levaquin upon discharge to complete therapy  - continue BD's - continue with steroid tapering upon discharge as outlined below   Active Problems:  Acute hyponatremia - mild, resolved    Hypokalemia - supplemented and WNL this AM  Code Status: Full.  Family Communication: plan of care discussed with the patient Disposition Plan: Pt insists on going home  IV access:  Peripheral IV  Procedures and diagnostic studies:    Imaging Results    X-ray Chest Pa And Lateral  04/09/2015 CLINICAL DATA: Cough, shortness of  breath, COPD EXAM: CHEST 2 VIEW COMPARISON: 04/08/2015 FINDINGS: Cardiomediastinal silhouette is stable. Hyperinflation again noted. Stable chronic mild interstitial prominence. No definite superimposed infiltrate or pulmonary edema. IMPRESSION: No active disease. Stable hyperinflation and chronic mild interstitial prominence. Electronically Signed By: Natasha Mead M.D. On: 04/09/2015 09:44   Dg Chest 2 View  04/08/2015 CLINICAL DATA: Increasing shortness of breath for 4 days. Cough, COPD EXAM: CHEST 2 VIEW COMPARISON: 08/24/2014 FINDINGS: There is hyperinflation of the lungs compatible with COPD. Heart and mediastinal contours are within normal limits. No focal opacities or effusions. No acute bony abnormality. IMPRESSION: COPD. No active disease. Electronically Signed By: Charlett Nose M.D.  On: 04/08/2015 12:19     Medical Consultants:  None  Other Consultants:  None  IAnti-Infectives:   Levaquin 2/21 -->  Debbora Presto, MD [facility] Pager [phone]  If 7PM-7AM, please contact night-coverage [email or web address] Password [email or web address] 04/10/2015, 1:24 PM        Discharge Exam: Filed Vitals:   04/11/15 0830 04/11/15 1010  BP: 141/76 115/88  Pulse: 60 98  Temp: 97.7 F (36.5 C)   Resp:     Filed Vitals:   04/11/15 0539 04/11/15 0745 04/11/15 0830 04/11/15 1010  BP: 143/88  141/76 115/88  Pulse: 70  60 98  Temp: 97.6 F (36.4 C)  97.7 F (36.5 C)   TempSrc: Oral  Oral   Resp: 20     Height:      Weight:      SpO2: 97% 95% 100%     General: Pt is alert, follows commands appropriately, not in acute distress Cardiovascular: Regular rate and rhythm, S1/S2 +, no murmurs,  no rubs, no gallops Respiratory: Better air movement with diminished breath sounds at bases  Abdominal: Soft, non tender, non distended, bowel sounds +, no guarding Extremities: no edema, no cyanosis, pulses palpable bilaterally DP and PT Neuro: Grossly nonfocal  Discharge  Instructions  Discharge Instructions    Diet - low sodium heart healthy    Complete by:  As directed      Increase activity slowly    Complete by:  As directed             Medication List    TAKE these medications        albuterol 108 (90 Base) MCG/ACT inhaler  Commonly known as:  PROVENTIL HFA;VENTOLIN HFA  Inhale 2 puffs into the lungs every 6 (six) hours as needed for wheezing or shortness of breath.     albuterol (2.5 MG/3ML) 0.083% nebulizer solution  Commonly known as:  PROVENTIL  Take 3 mLs (2.5 mg total) by nebulization every 6 (six) hours as needed for wheezing or shortness of breath.     amLODipine 10 MG tablet  Commonly known as:  NORVASC  Take 10 mg by mouth daily.     ASMANEX HFA 100 MCG/ACT Aero  Generic drug:  Mometasone Furoate  Inhale 1 puff into the lungs daily as needed (shortness of breath).     budesonide 0.25 MG/2ML nebulizer solution  Commonly known as:  PULMICORT  Take 2 mLs (0.25 mg total) by nebulization 2 (two) times daily.     CRESTOR 20 MG tablet  Generic drug:  rosuvastatin  Take 20 mg by mouth at bedtime.     guaiFENesin 600 MG 12 hr tablet  Commonly known as:  MUCINEX  Take 600 mg by mouth 2 (two) times daily as needed for cough.     hydrochlorothiazide 25 MG tablet  Commonly known as:  HYDRODIURIL  Take 25 mg by mouth daily.     levofloxacin 500 MG tablet  Commonly known as:  LEVAQUIN  Take 1 tablet (500 mg total) by mouth daily.     metoprolol 50 MG tablet  Commonly known as:  LOPRESSOR  Take 50 mg by mouth 2 (two) times daily.     OVER THE COUNTER MEDICATION  Take 1 Dose by mouth daily as needed (cough). Food lion cough syrup     oxyCODONE 5 MG immediate release tablet  Commonly known as:  Oxy IR/ROXICODONE  Take 1 tablet (5 mg total) by mouth every 4 (four) hours as needed for moderate pain.     predniSONE 10 MG tablet  Commonly known as:  DELTASONE  Take 50 mg tablet 2/25 and taper down by 10 mg daily until completed      tiotropium 18 MCG inhalation capsule  Commonly known as:  SPIRIVA  Place 18 mcg into inhaler and inhale daily.            Follow-up Information    Call Debbora Presto, MD.   Specialty:  Internal Medicine   Why:  As needed call my cell phone [phone]   Contact information:   [address] [place] [address] [phone]        The results of significant diagnostics from this hospitalization (including imaging, microbiology, ancillary and laboratory) are listed below for reference.     Microbiology: Recent Results (from the past 240 hour(s))  Urine culture     Status: None   Collection Time: 04/09/15 10:00 AM  Result Value Ref Range Status  Specimen Description URINE, RANDOM  Final   Special Requests NONE  Final   Culture   Final    NO GROWTH 1 DAY Performed at [facility]    Report Status 04/10/2015 FINAL  Final     Labs: Basic Metabolic Panel:  Recent Labs Lab 04/08/15 1203 04/08/15 1611 04/09/15 0506 04/10/15 0443 04/11/15 0505  NA 131*  --  133* 136 136  K 2.8*  --  4.0 4.9 4.6  CL 90*  --  98* 103 99*  CO2 26  --  GLUCOSE 142*  --  133* 122* 120*  BUN 36*  --  35* 32* 27*  CREATININE 1.23  --  1.10 0.94 0.91  CALCIUM 9.1  --  9.1 9.3 9.3  MG  --  2.4  --   --   --    Liver Function Tests:  Recent Labs Lab 04/08/15 1203 04/09/15 0506  AST 43* 37  ALT 30 30  ALKPHOS 68 66  BILITOT 1.2 0.7  PROT 7.2 6.5  ALBUMIN 3.4* 3.1*   CBC:  Recent Labs Lab 04/08/15 1203 04/09/15 0506 04/10/15 0443 04/11/15 0505  WBC 8.0 10.1 15.3* 13.8*  HGB 12.5* 11.5* 11.7* 12.2*  HCT 36.9* 33.8* 34.6* 36.0*  MCV 84.6 85.4 87.2 87.4  PLT 302 334 389 409*   Cardiac Enzymes:  Recent Labs Lab 04/08/15 1203 04/08/15 1611 04/08/15 2259 04/09/15 0506  TROPONINI <0.03 <0.03 <0.03 <0.03   BNP: BNP (last 3 results)  Recent Labs  04/08/15 1203  BNP 54.2   SIGNED: Time coordinating discharge: 30  minutes  Debbora Presto, MD  [facility] 04/11/2015, 10:58 AM Pager [phone]  If 7PM-7AM, please contact night-coverage [email or web address] Password [email or web address]

## 2015-04-11 NOTE — Progress Notes (Signed)
Pt discharged home with [facility] for COPD.

## 2015-04-11 NOTE — Discharge Instructions (Signed)
Chronic Obstructive Pulmonary Disease Chronic obstructive pulmonary disease (COPD) is a common lung condition in which airflow from the lungs is limited. COPD is a general term that can be used to describe many different lung problems that limit airflow, including both chronic bronchitis and emphysema. If you have COPD, your lung function will probably never return to normal, but there are measures you can take to improve lung function and make yourself feel better. CAUSES   Smoking (common).  Exposure to secondhand smoke.  Genetic problems.  Chronic inflammatory lung diseases or recurrent infections. SYMPTOMS  Shortness of breath, especially with physical activity.  Deep, persistent (chronic) cough with a large amount of thick mucus.  Wheezing.  Rapid breaths (tachypnea).  Gray or bluish discoloration (cyanosis) of the skin, especially in your fingers, toes, or lips.  Fatigue.  Weight loss.  Frequent infections or episodes when breathing symptoms become much worse (exacerbations).  Chest tightness. DIAGNOSIS Your health care provider will take a medical history and perform a physical examination to diagnose COPD. Additional tests for COPD may include:  Lung (pulmonary) function tests.  Chest X-ray.  CT scan.  Blood tests. TREATMENT  Treatment for COPD may include:  Inhaler and nebulizer medicines. These help manage the symptoms of COPD and make your breathing more comfortable.  Supplemental oxygen. Supplemental oxygen is only helpful if you have a low oxygen level in your blood.  Exercise and physical activity. These are beneficial for nearly all people with COPD.  Lung surgery or transplant.  Nutrition therapy to gain weight, if you are underweight.  Pulmonary rehabilitation. This may involve working with a team of health care providers and specialists, such as respiratory, occupational, and physical therapists. HOME CARE INSTRUCTIONS  Take all medicines  (inhaled or pills) as directed by your health care provider.  Avoid over-the-counter medicines or cough syrups that dry up your airway (such as antihistamines) and slow down the elimination of secretions unless instructed otherwise by your health care provider.  If you are a smoker, the most important thing that you can do is stop smoking. Continuing to smoke will cause further lung damage and breathing trouble. Ask your health care provider for help with quitting smoking. He or she can direct you to community resources or hospitals that provide support.  Avoid exposure to irritants such as smoke, chemicals, and fumes that aggravate your breathing.  Use oxygen therapy and pulmonary rehabilitation if directed by your health care provider. If you require home oxygen therapy, ask your health care provider whether you should purchase a pulse oximeter to measure your oxygen level at home.  Avoid contact with individuals who have a contagious illness.  Avoid extreme temperature and humidity changes.  Eat healthy foods. Eating smaller, more frequent meals and resting before meals may help you maintain your strength.  Stay active, but balance activity with periods of rest. Exercise and physical activity will help you maintain your ability to do things you want to do.  Preventing infection and hospitalization is very important when you have COPD. Make sure to receive all the vaccines your health care provider recommends, especially the pneumococcal and influenza vaccines. Ask your health care provider whether you need a pneumonia vaccine.  Learn and use relaxation techniques to manage stress.  Learn and use controlled breathing techniques as directed by your health care provider. Controlled breathing techniques include:  Pursed lip breathing. Start by breathing in (inhaling) through your nose for 1 second. Then, purse your lips as if you were   going to whistle and breathe out (exhale) through the  pursed lips for 2 seconds.  Diaphragmatic breathing. Start by putting one hand on your abdomen just above your waist. Inhale slowly through your nose. The hand on your abdomen should move out. Then purse your lips and exhale slowly. You should be able to feel the hand on your abdomen moving in as you exhale.  Learn and use controlled coughing to clear mucus from your lungs. Controlled coughing is a series of short, progressive coughs. The steps of controlled coughing are: 1. Lean your head slightly forward. 2. Breathe in deeply using diaphragmatic breathing. 3. Try to hold your breath for 3 seconds. 4. Keep your mouth slightly open while coughing twice. 5. Spit any mucus out into a tissue. 6. Rest and repeat the steps once or twice as needed. SEEK MEDICAL CARE IF:  You are coughing up more mucus than usual.  There is a change in the color or thickness of your mucus.  Your breathing is more labored than usual.  Your breathing is faster than usual. SEEK IMMEDIATE MEDICAL CARE IF:  You have shortness of breath while you are resting.  You have shortness of breath that prevents you from:  Being able to talk.  Performing your usual physical activities.  You have chest pain lasting longer than 5 minutes.  Your skin color is more cyanotic than usual.  You measure low oxygen saturations for longer than 5 minutes with a pulse oximeter. MAKE SURE YOU:  Understand these instructions.  Will watch your condition.  Will get help right away if you are not doing well or get worse.   This information is not intended to replace advice given to you by your health care provider. Make sure you discuss any questions you have with your health care provider.   Document Released: 11/11/2004 Document Revised: 02/22/2014 Document Reviewed: 09/28/2012 Elsevier Interactive Patient Education 2016 Elsevier Inc.  

## 2015-04-11 NOTE — Progress Notes (Signed)
D/C instructions reviewed w/ pt and niece. Both verbalize understanding and all questions answered. Pt d/c in stable condition in w/c to family's car by NT. Pt in possession of d/c instructions, scripts, and all personal belongings.

## 2015-04-12 LAB — RESPIRATORY VIRUS PANEL
ADENOVIRUS: NEGATIVE
INFLUENZA B 1: NEGATIVE
Influenza A: NEGATIVE
METAPNEUMOVIRUS: POSITIVE — AB
PARAINFLUENZA 2 A: NEGATIVE
PARAINFLUENZA 3 A: NEGATIVE
Parainfluenza 1: NEGATIVE
RHINOVIRUS: NEGATIVE
Respiratory Syncytial Virus A: NEGATIVE
Respiratory Syncytial Virus B: NEGATIVE

## 2015-08-05 ENCOUNTER — Encounter (HOSPITAL_COMMUNITY): Payer: Self-pay | Admitting: Emergency Medicine

## 2015-11-23 ENCOUNTER — Encounter (HOSPITAL_COMMUNITY): Payer: Self-pay | Admitting: *Deleted

## 2015-11-23 ENCOUNTER — Emergency Department (HOSPITAL_COMMUNITY)
Admission: EM | Admit: 2015-11-23 | Discharge: 2015-11-23 | Disposition: A | Discharge status: Home / Self Care | Payer: Medicare Other | Attending: Emergency Medicine | Admitting: Emergency Medicine

## 2015-11-23 ENCOUNTER — Emergency Department (HOSPITAL_COMMUNITY): Payer: Medicare Other

## 2015-11-23 DIAGNOSIS — F17200 Nicotine dependence, unspecified, uncomplicated: Secondary | ICD-10-CM | POA: Insufficient documentation

## 2015-11-23 DIAGNOSIS — I10 Essential (primary) hypertension: Secondary | ICD-10-CM | POA: Insufficient documentation

## 2015-11-23 DIAGNOSIS — Z79899 Other long term (current) drug therapy: Secondary | ICD-10-CM | POA: Diagnosis not present

## 2015-11-23 DIAGNOSIS — Z8673 Personal history of transient ischemic attack (TIA), and cerebral infarction without residual deficits: Secondary | ICD-10-CM | POA: Diagnosis not present

## 2015-11-23 DIAGNOSIS — J441 Chronic obstructive pulmonary disease with (acute) exacerbation: Secondary | ICD-10-CM | POA: Diagnosis not present

## 2015-11-23 DIAGNOSIS — R0602 Shortness of breath: Secondary | ICD-10-CM

## 2015-11-23 LAB — CBC
HEMATOCRIT: 41.1 % (ref 39.0–52.0)
HEMOGLOBIN: 13.7 g/dL (ref 13.0–17.0)
MCH: 29.1 pg (ref 26.0–34.0)
MCHC: 33.3 g/dL (ref 30.0–36.0)
MCV: 87.4 fL (ref 78.0–100.0)
Platelets: 232 10*3/uL (ref 150–400)
RBC: 4.7 MIL/uL (ref 4.22–5.81)
RDW: 13 % (ref 11.5–15.5)
WBC: 7.9 10*3/uL (ref 4.0–10.5)

## 2015-11-23 LAB — BASIC METABOLIC PANEL
ANION GAP: 9 (ref 5–15)
BUN: 15 mg/dL (ref 6–20)
CHLORIDE: 96 mmol/L — AB (ref 101–111)
CO2: 30 mmol/L (ref 22–32)
CREATININE: 1.06 mg/dL (ref 0.61–1.24)
Calcium: 9.7 mg/dL (ref 8.9–10.3)
GFR calc non Af Amer: >60 mL/min (ref >60)
Glucose, Bld: 96 mg/dL (ref 65–99)
POTASSIUM: 3.1 mmol/L — AB (ref 3.5–5.1)
Sodium: 135 mmol/L (ref 135–145)

## 2015-11-23 MED ORDER — ONDANSETRON HCL 4 MG/2ML IJ SOLN
4.0000 mg | Freq: Once | INTRAMUSCULAR | Status: AC
  Administered 2015-11-23: 4 mg via INTRAVENOUS
  Filled 2015-11-23: qty 2

## 2015-11-23 MED ORDER — IPRATROPIUM BROMIDE 0.02 % IN SOLN
1.5000 mg | Freq: Once | RESPIRATORY_TRACT | Status: AC
  Administered 2015-11-23: 1.5 mg via RESPIRATORY_TRACT

## 2015-11-23 MED ORDER — AZITHROMYCIN 250 MG PO TABS: 250.0000 mg | ORAL_TABLET | Freq: Every day | ORAL | 0 refills | Status: DC

## 2015-11-23 MED ORDER — POTASSIUM CHLORIDE CRYS ER 20 MEQ PO TBCR
40.0000 meq | EXTENDED_RELEASE_TABLET | Freq: Once | ORAL | Status: AC
  Administered 2015-11-23: 40 meq via ORAL
  Filled 2015-11-23: qty 2

## 2015-11-23 MED ORDER — ALBUTEROL SULFATE HFA 108 (90 BASE) MCG/ACT IN AERS: 2.0000 | INHALATION_SPRAY | RESPIRATORY_TRACT | 2 refills | Status: DC | PRN

## 2015-11-23 MED ORDER — GI COCKTAIL ~~LOC~~
30.0000 mL | Freq: Once | ORAL | Status: AC
  Administered 2015-11-23: 30 mL via ORAL
  Filled 2015-11-23: qty 30

## 2015-11-23 MED ORDER — PREDNISONE 10 MG PO TABS: 50.0000 mg | ORAL_TABLET | Freq: Every day | ORAL | 0 refills | Status: AC

## 2015-11-23 MED ORDER — IPRATROPIUM-ALBUTEROL 0.5-2.5 (3) MG/3ML IN SOLN: 3.0000 mL | RESPIRATORY_TRACT | Status: DC

## 2015-11-23 MED ORDER — IPRATROPIUM BROMIDE 0.02 % IN SOLN
RESPIRATORY_TRACT | Status: AC
Start: 2015-11-23 — End: 2015-11-23
  Administered 2015-11-23: 1.5 mg via RESPIRATORY_TRACT
  Filled 2015-11-23: qty 7.5

## 2015-11-23 MED ORDER — ALBUTEROL SULFATE (2.5 MG/3ML) 0.083% IN NEBU: 5.0000 mg | INHALATION_SOLUTION | Freq: Once | RESPIRATORY_TRACT | Status: DC

## 2015-11-23 MED ORDER — ALBUTEROL (5 MG/ML) CONTINUOUS INHALATION SOLN
10.0000 mg/h | INHALATION_SOLUTION | RESPIRATORY_TRACT | Status: DC
  Administered 2015-11-23: 10 mg/h via RESPIRATORY_TRACT

## 2015-11-23 MED ORDER — ALBUTEROL (5 MG/ML) CONTINUOUS INHALATION SOLN
INHALATION_SOLUTION | RESPIRATORY_TRACT | Status: AC
  Administered 2015-11-23: 10 mg/h via RESPIRATORY_TRACT
  Filled 2015-11-23: qty 20

## 2015-11-23 MED ORDER — AZITHROMYCIN 250 MG PO TABS
500.0000 mg | ORAL_TABLET | Freq: Once | ORAL | Status: AC
  Administered 2015-11-23: 500 mg via ORAL
  Filled 2015-11-23: qty 2

## 2015-11-23 NOTE — ED Notes (Signed)
Patient ambulated on 2L [place].  During ambulation patient's heart rate increased from 97 to 130, and O2 saturation decreased from 95% to 86%.

## 2015-11-23 NOTE — Discharge Instructions (Signed)
YOU MUST SEE YOUR PCP FOR REASSESSMENT OF OXYGEN NEED. DO NOT SMOKE W/ OXYGEN. YOU NEED TO BE ON OXYGEN 2 LITERS NASAL CANULA AT ALL TIMES.

## 2015-11-23 NOTE — ED Provider Notes (Signed)
[facility]-EMERGENCY DEPT Provider Note  CSN: [id number] Arrival Date & Time: 11/23/15 @ 1428  History    Chief Complaint Chief Complaint  Patient presents with  . Shortness of Breath    HPI Spencer Banks is a 70 y.o. male.  Presents for Cough, SOB and wheezing for 1 week. Worse over the last 3 days.   Denies CP, emesis, back pain, fever or chills.  Has history of COPD and states feels like "a bad lung attack". Patient is abusing tobacco.  Past Medical & Surgical History    Past Medical History:  Diagnosis Date  . COPD (chronic obstructive pulmonary disease) (HCC)   . Hypertension   . Transient ischemic attack (TIA)    Patient Active Problem List   Diagnosis Date Noted  . Protein-calorie malnutrition, severe 04/09/2015  . COPD exacerbation (HCC) 04/08/2015  . Weight loss, unintentional 04/08/2015  . Acute hyponatremia 04/08/2015  . Hypokalemia 04/08/2015  . Acute exacerbation of COPD with asthma (HCC) 04/08/2015   Past Surgical History:  Procedure Laterality Date  . hemorrohoid surgery      Family & Social History    History reviewed. No pertinent family history. Social History  Substance Use Topics  . Smoking status: Current Every Day Smoker  . Smokeless tobacco: Never Used  . Alcohol use No    Home Medications    Prior to Admission medications   Medication Sig Start Date End Date Taking? Authorizing Provider  albuterol (PROVENTIL HFA;VENTOLIN HFA) 108 (90 Base) MCG/ACT inhaler Inhale 2 puffs into the lungs every 6 (six) hours as needed for wheezing or shortness of breath. 04/11/15   Dorothea OgleIskra M Myers, MD  albuterol (PROVENTIL) (2.5 MG/3ML) 0.083% nebulizer solution Take 3 mLs (2.5 mg total) by nebulization every 6 (six) hours as needed for wheezing or shortness of breath. 04/11/15   Dorothea OgleIskra M Myers, MD  amLODipine (NORVASC) 10 MG tablet Take 10 mg by mouth daily. 07/10/13   Historical Provider, MD  ASMANEX HFA 100 MCG/ACT AERO Inhale 1 puff into the lungs daily as  needed (shortness of breath).  02/03/15   Historical Provider, MD  bacitracin ointment Apply 1 application topically 2 (two) times daily. 02/01/15   Rachelle HoraKeri Smith, MD  budesonide (PULMICORT) 0.25 MG/2ML nebulizer solution Take 2 mLs (0.25 mg total) by nebulization 2 (two) times daily. 04/11/15   Dorothea OgleIskra M Myers, MD  CRESTOR 20 MG tablet Take 20 mg by mouth at bedtime.  08/19/14   Historical Provider, MD  guaiFENesin (MUCINEX) 600 MG 12 hr tablet Take 600 mg by mouth 2 (two) times daily as needed for cough.    Historical Provider, MD  hydrochlorothiazide (HYDRODIURIL) 25 MG tablet Take 25 mg by mouth daily. 09/06/13   Historical Provider, MD  HYDROcodone-acetaminophen (NORCO/VICODIN) 5-325 MG tablet Take 1-2 tablets by mouth every 4 (four) hours as needed. 02/01/15   Rachelle HoraKeri Smith, MD  levofloxacin (LEVAQUIN) 500 MG tablet Take 1 tablet (500 mg total) by mouth daily. 04/11/15   Dorothea OgleIskra M Myers, MD  metoprolol (LOPRESSOR) 50 MG tablet Take 50 mg by mouth 2 (two) times daily. 07/12/14   Historical Provider, MD  OVER THE COUNTER MEDICATION Take 1 Dose by mouth daily as needed (cough). Food lion cough syrup    Historical Provider, MD  oxyCODONE (OXY IR/ROXICODONE) 5 MG immediate release tablet Take 1 tablet (5 mg total) by mouth every 4 (four) hours as needed for moderate pain. 04/11/15   Dorothea OgleIskra M Myers, MD  predniSONE (DELTASONE) 10 MG tablet Take 50  mg tablet 2/25 and taper down by 10 mg daily until completed 04/11/15   Dorothea Ogle, MD  tiotropium (SPIRIVA) 18 MCG inhalation capsule Place 18 mcg into inhaler and inhale daily.    Historical Provider, MD    Allergies    Review of patient's allergies indicates no known allergies.  I reviewed & agree with nursing's documentation on the patient's past medical, surgical, social & family histories as well as their allergies.  Review of Systems  Complete ROS obtained, and is negative except as stated in HPI.  Physical Exam  Updated Vital Signs BP 132/86 (BP  Location: Right Arm)   Pulse 88   Temp 97.9 F (36.6 C) (Oral)   Resp (!) 28   SpO2 97%  I have reviewed the triage vital signs and the nursing notes. Physical Exam CONST: Patient alert, well appearing, in no apparent distress, oriented to person, place and time.  EYES: PERRLA. EOMI. Conjunctiva w/o d/c. Lids AT w/o swelling.  ENMT: External Nares & Ears AT w/o swelling. Oropharynx patent. MM moist.  NECK: ROM full w/o rigidity. Trachea midline. JVD absent.  CVS: +S1/S2 w/o obvious murmur. Lower extremities w/o pitting edema.  RESP: Respiratory effort unlabored w/o retractions & accessory muscle use. BS w/ diffuse wheezing bilaterally.  GI: Soft & ND. +BS x 4. TTP absent. Hernia absent. Guarding & Rebound absent.  BACK: CVA TTP absent bilaterally.  SKIN: Skin warm & dry. Turgor good. No rash.  PSYCH: Alert. Oriented. Affect and mood appropriate.  NEURO: CN II-XII grossly intact. Motor exam symmetric w/ upper & lower extremities 5/5 bilaterally. Sensation grossly intact.  MSK: Joints located & stable, w/o obvious dislocation & obvious deformity or crepitus absent w/ Cap refill < 2 sec. Peripheral pulses 2+ & equal in all extremities.   ED Treatments & Results   Labs (only abnormal results are displayed) Labs Reviewed  BASIC METABOLIC PANEL  CBC    EKG    EKG Interpretation  Date/Time:    Ventricular Rate:    PR Interval:    QRS Duration:   QT Interval:    QTC Calculation:   R Axis:     Text Interpretation:         Radiology No results found.  Pertinent labs & imaging results that were available during my care of the patient were independently visualized by me and considered in my medical decision making, please see chart for details.  Procedures (including critical care time) Procedures  Medications Ordered in ED Medications  albuterol (PROVENTIL) (2.5 MG/3ML) 0.083% nebulizer solution 5 mg (not administered)    Initial Impression & Plan / ED Course & Results  / Final Disposition   Initial Impression & Plan Patient presents emergency department with diffuse wheezing and shortness breath. Denies chest pain is afebrile and has no cough. Patient does endorse change in sputum over the past several days and therefore was provided azithromycin and obtained EKG chest x-ray and laboratory work in the emergency department. EMS provided the patient with Solu-Medrol and to do nebs prior to arrival. Patient endorses that shortness of breath significantly improved upon arrival however in bed still has wheezing. Therefore patient provided DuoNeb continuous nebulizer and spaced to MDI albuterol inhaler and upon reassessment patient has resolution of wheezing and endorses no more symptoms of shortness of breath.  ED Course & Results EKG reviewed and I appreciate no evidence of acute STEMI arrhythmia or concerning T-wave inversion however patient does have nonspecific ST depression. Due to absence  of chest pain do not have concern for ACS at this time. Chest x-ray reviewed and I appreciate no evidence of pneumothorax or acute cardiopulmonary disease and no obvious infiltrate concerning for pneumonia. Laboratory work unremarkable.  Final Disposition Reassessment of the patient reveals patient In no acute distress and ambulation trial with patient's home O2 oxygen level of 2 L nasal cannula performed. Patient able to ambulate without concerns for syncope lightheadedness or ataxia. Patient has saturations at baseline between upper 80s and 90% which are believed to be chronic and due to patient's adamant request for discharge conversation was had with patient that he must see his primary care physician in the next 2 days for reassessment of increased oxygen need and 4 stepup therapy in COPD treatment. Patient states he will do so and states understanding.  ED Course in its entirety, care plan & clinical impressions w/ associated risks were reviewed w/ the patient and relative(s).  In light of the patient's reassuring evaluation above, I consider discharge disposition reasonable. They are in agreement. I gave my typical, strict return precautions in simple, non-technical language. We also discussed symptoms that are most concerning & would necessitate emergent return. I explicitly told them to immediately return to the ED if new symptoms develop, if worse, or for ANY concern. Treatments & follow up plan agreed upon & I confirmed all concerns & questions were addressed prior to discharge.  Follow Up [facility] [address] [place] [address] [phone] Schedule an appointment as soon as possible for a visit in 2 days to establish Primary Care, For symptomatic reassessment  [facility] EMERGENCY DEPARTMENT [address] [id number] mc [place] [address] [phone] Go to  FOR ANY CONCERNS OR IF WORSE  Your Primary Care Physician FOR REASSESSMENT OF OXYGEN NEED. DO NOT SMOKE W/ OXYGEN. YOU NEED TO BE ON OXYGEN 2 LITERS NASAL CANULA AT ALL TIMES.     New Prescriptions New Prescriptions   ALBUTEROL (PROVENTIL HFA;VENTOLIN HFA) 108 (90 BASE) MCG/ACT INHALER    Inhale 2 puffs into the lungs every 4 (four) hours as needed for wheezing or shortness of breath.   AZITHROMYCIN (ZITHROMAX) 250 MG TABLET    Take 1 tablet (250 mg total) by mouth daily. Take 1 every day until finished.   PREDNISONE (DELTASONE) 10 MG TABLET    Take 5 tablets (50 mg total) by mouth daily.    Final Clinical Impression & ED Diagnoses  No diagnosis found. Patient care discussed with the attending physician, Dr. Denton Lank, who oversaw their evaluation & treatment & voiced agreement.  Note: This document was prepared using Dragon voice recognition software and may include unintentional dictation errors.  House Officer: Jonette Eva, MD, Emergency Medicine Resident.   Jonette Eva, MD 11/23/15 1921      Cathren Laine, MD 11/23/15 [phone]

## 2015-11-23 NOTE — ED Notes (Signed)
Nurse currently drawing blood. 

## 2015-11-23 NOTE — ED Triage Notes (Addendum)
Pt arrived by [facility] for sob. Had decreased lung sounds initially and spo2 84% on 2L [place], neb started and now has wheezing and spo2 98%. Hx of copd. Received 2 nebs pta and solumedrol 125mg  iv pta.

## 2015-11-23 NOTE — ED Notes (Signed)
Pt and family understood dc material. NAD noted. Scripts given at dc 

## 2016-02-04 ENCOUNTER — Encounter (HOSPITAL_BASED_OUTPATIENT_CLINIC_OR_DEPARTMENT_OTHER): Payer: Self-pay

## 2016-02-04 ENCOUNTER — Emergency Department (HOSPITAL_BASED_OUTPATIENT_CLINIC_OR_DEPARTMENT_OTHER)
Admission: EM | Admit: 2016-02-04 | Discharge: 2016-02-04 | Disposition: A | Discharge status: Home / Self Care | Payer: Medicare Other | Attending: Physician Assistant | Admitting: Physician Assistant

## 2016-02-04 DIAGNOSIS — Y939 Activity, unspecified: Secondary | ICD-10-CM | POA: Diagnosis not present

## 2016-02-04 DIAGNOSIS — I10 Essential (primary) hypertension: Secondary | ICD-10-CM | POA: Insufficient documentation

## 2016-02-04 DIAGNOSIS — M7021 Olecranon bursitis, right elbow: Secondary | ICD-10-CM | POA: Diagnosis not present

## 2016-02-04 DIAGNOSIS — J449 Chronic obstructive pulmonary disease, unspecified: Secondary | ICD-10-CM | POA: Diagnosis not present

## 2016-02-04 DIAGNOSIS — F17200 Nicotine dependence, unspecified, uncomplicated: Secondary | ICD-10-CM | POA: Insufficient documentation

## 2016-02-04 DIAGNOSIS — M7989 Other specified soft tissue disorders: Secondary | ICD-10-CM | POA: Diagnosis present

## 2016-02-04 DIAGNOSIS — Z79899 Other long term (current) drug therapy: Secondary | ICD-10-CM | POA: Insufficient documentation

## 2016-02-04 MED ORDER — NAPROXEN 500 MG PO TABS: 500.0000 mg | ORAL_TABLET | Freq: Two times a day (BID) | ORAL | 0 refills | Status: DC

## 2016-02-04 NOTE — ED Triage Notes (Signed)
Pt with abscess to right elbow x 2 weeks-denies injury-pt with labored breathing-steady gait-states is his baseline due to COPD-pt used inhaler during triage-states he is on home O2 and will let staff know if he needs O2

## 2016-02-04 NOTE — ED Provider Notes (Signed)
[facility]   CSN: [id number] Arrival date & time: 02/04/16  1724  By signing my name below, I, Linna DarnerRussell Turner, attest that this documentation has been prepared under the direction and in the presence Cheri FowlerKayla Wilbert Hayashi, PA-C. Electronically Signed: Linna Darnerussell Turner, Scribe. 02/04/2016. 6:24 PM.  History   Chief Complaint Chief Complaint  Patient presents with  . Abscess    The history is provided by the patient. No language interpreter was used.     HPI Comments: Spencer Banks is a 70 y.o. male who presents to the Emergency Department complaining of severe swelling to his right elbow beginning 3-4 days ago. No recent injury or trauma to his right elbow; he states he woke up with swelling to his right elbow. No h/o the same in the past. He notes his can move his right arm normally. He notes his shortness of breath is at baseline. He denies right elbow pain, fever, chills, overlying redness, or any other associated symptoms.  Past Medical History:  Diagnosis Date  . COPD (chronic obstructive pulmonary disease) (HCC)   . Hypertension   . Transient ischemic attack (TIA)     Patient Active Problem List   Diagnosis Date Noted  . Protein-calorie malnutrition, severe 04/09/2015  . COPD exacerbation (HCC) 04/08/2015  . Weight loss, unintentional 04/08/2015  . Acute hyponatremia 04/08/2015  . Hypokalemia 04/08/2015  . Acute exacerbation of COPD with asthma (HCC) 04/08/2015    Past Surgical History:  Procedure Laterality Date  . hemorrohoid surgery         Home Medications    Prior to Admission medications   Medication Sig Start Date End Date Taking? Authorizing Provider  albuterol (PROVENTIL HFA;VENTOLIN HFA) 108 (90 Base) MCG/ACT inhaler Inhale 2 puffs into the lungs every 4 (four) hours as needed for wheezing or shortness of breath. 11/23/15 11/24/15  Jonette EvaBrad Chapman, MD  albuterol (PROVENTIL HFA;VENTOLIN HFA) 108 (90 Base) MCG/ACT inhaler Inhale 2 puffs  into the lungs every 6 (six) hours as needed for wheezing or shortness of breath. 04/11/15   Dorothea OgleIskra M Myers, MD  albuterol (PROVENTIL) (2.5 MG/3ML) 0.083% nebulizer solution Take 3 mLs (2.5 mg total) by nebulization every 6 (six) hours as needed for wheezing or shortness of breath. 04/11/15   Dorothea OgleIskra M Myers, MD  amLODipine (NORVASC) 10 MG tablet Take 10 mg by mouth daily. 07/10/13   Historical Provider, MD  ASMANEX HFA 100 MCG/ACT AERO Inhale 1 puff into the lungs daily as needed (shortness of breath).  02/03/15   Historical Provider, MD  azithromycin (ZITHROMAX) 250 MG tablet Take 1 tablet (250 mg total) by mouth daily. Take 1 every day until finished. 11/23/15   Jonette EvaBrad Chapman, MD  bacitracin ointment Apply 1 application topically 2 (two) times daily. Patient not taking: Reported on 11/23/2015 02/01/15   Rachelle HoraKeri Smith, MD  budesonide (PULMICORT) 0.25 MG/2ML nebulizer solution Take 2 mLs (0.25 mg total) by nebulization 2 (two) times daily. 04/11/15   Dorothea OgleIskra M Myers, MD  CRESTOR 20 MG tablet Take 20 mg by mouth at bedtime.  08/19/14   Historical Provider, MD  guaiFENesin (MUCINEX) 600 MG 12 hr tablet Take 600 mg by mouth 2 (two) times daily as needed for cough.    Historical Provider, MD  hydrochlorothiazide (HYDRODIURIL) 25 MG tablet Take 25 mg by mouth daily. 09/06/13   Historical Provider, MD  HYDROcodone-acetaminophen (NORCO/VICODIN) 5-325 MG tablet Take 1-2 tablets by mouth every 4 (four) hours as needed. Patient not taking: Reported on 11/23/2015 02/01/15  Rachelle Hora, MD  levofloxacin (LEVAQUIN) 500 MG tablet Take 1 tablet (500 mg total) by mouth daily. Patient not taking: Reported on 11/23/2015 04/11/15   Dorothea Ogle, MD  metoprolol (LOPRESSOR) 50 MG tablet Take 50 mg by mouth 2 (two) times daily. 07/12/14   Historical Provider, MD  naproxen (NAPROSYN) 500 MG tablet Take 1 tablet (500 mg total) by mouth 2 (two) times daily. 02/04/16   Cheri Fowler, PA-C  OVER THE COUNTER MEDICATION Take 1 Dose by mouth daily as  needed (cough). Food lion cough syrup    Historical Provider, MD  oxyCODONE (OXY IR/ROXICODONE) 5 MG immediate release tablet Take 1 tablet (5 mg total) by mouth every 4 (four) hours as needed for moderate pain. Patient not taking: Reported on 11/23/2015 04/11/15   Dorothea Ogle, MD  tiotropium (SPIRIVA) 18 MCG inhalation capsule Place 18 mcg into inhaler and inhale daily.    Historical Provider, MD    Family History No family history on file.  Social History Social History  Substance Use Topics  . Smoking status: Current Every Day Smoker  . Smokeless tobacco: Never Used  . Alcohol use No     Allergies   Patient has no known allergies.   Review of Systems Review of Systems  Constitutional: Negative for chills and fever.  Respiratory: Positive for shortness of breath (baseline, no acute changes).   Musculoskeletal: Positive for joint swelling (right elbow). Negative for arthralgias.  All other systems reviewed and are negative.    Physical Exam Updated Vital Signs BP 131/79 (BP Location: Right Arm)   Pulse 71   Temp 97.5 F (36.4 C) (Oral)   Resp 12   Ht 6' (1.829 m)   Wt 63.5 kg   SpO2 92%   BMI 18.99 kg/m   Physical Exam  Constitutional: He is oriented to person, place, and time. He appears well-developed and well-nourished.  HENT:  Head: Normocephalic and atraumatic.  Right Ear: External ear normal.  Left Ear: External ear normal.  Eyes: Conjunctivae are normal. No scleral icterus.  Neck: No tracheal deviation present.  Pulmonary/Chest: Effort normal. No respiratory distress.  Abdominal: He exhibits no distension.  Musculoskeletal: Normal range of motion.  Full active ROM of right elbow without pain.  Neurological: He is alert and oriented to person, place, and time.  Skin: Skin is warm and dry.  Moderate swelling about the olecranon without overlying warmth or induration. Non-tender.  Psychiatric: He has a normal mood and affect. His behavior is normal.      ED Treatments / Results  Labs (all labs ordered are listed, but only abnormal results are displayed) Labs Reviewed - No data to display  EKG  EKG Interpretation None       Radiology No results found.  Procedures Procedures (including critical care time)  DIAGNOSTIC STUDIES: Oxygen Saturation is 90% on RA, low by my interpretation.    COORDINATION OF CARE: 6:29 PM Discussed treatment plan with pt at bedside and pt agreed to plan.  Medications Ordered in ED Medications - No data to display   Initial Impression / Assessment and Plan / ED Course  I have reviewed the triage vital signs and the nursing notes.  Pertinent labs & imaging results that were available during my care of the patient were reviewed by me and considered in my medical decision making (see chart for details).  Clinical Course    Patient presents with findings c/w olecranon bursitis.  No overlying warmth, erythema, or induration  to suggest infection.  FAROM without pain.  Do not suspect septic joint.  This does not not warrant emergent aspiration.  Patient given referral to orthopedics.  Placed in ace wrap and given short course of NSAIDs.  Return precautions discussed.  Initally, patient's oxygen saturations 87% on arrival, he requires continuous home oxygen and did not bring this with him.  He does not have new shortness of breath, cough, or chest pain.  Upon resting, he oxygen saturations improved and he denies any shortness of breath.  Advised patient to wear home oxygen as prescribed and to return for any worsening SOB, cough, or CP.  Stable for discharge.   Final Clinical Impressions(s) / ED Diagnoses   Final diagnoses:  Olecranon bursitis of right elbow    New Prescriptions New Prescriptions   NAPROXEN (NAPROSYN) 500 MG TABLET    Take 1 tablet (500 mg total) by mouth 2 (two) times daily.   I personally performed the services described in this documentation, which was scribed in my presence.  The recorded information has been reviewed and is accurate.    Cheri FowlerKayla Antoino Westhoff, PA-C 02/04/16 1920    Courteney Randall AnLyn Mackuen, MD 02/05/16 2220

## 2016-02-10 ENCOUNTER — Emergency Department (HOSPITAL_BASED_OUTPATIENT_CLINIC_OR_DEPARTMENT_OTHER)
Admission: EM | Admit: 2016-02-10 | Discharge: 2016-02-10 | Disposition: A | Discharge status: Home / Self Care | Payer: Medicare Other | Attending: Emergency Medicine | Admitting: Emergency Medicine

## 2016-02-10 ENCOUNTER — Encounter (HOSPITAL_BASED_OUTPATIENT_CLINIC_OR_DEPARTMENT_OTHER): Payer: Self-pay | Admitting: *Deleted

## 2016-02-10 DIAGNOSIS — M7989 Other specified soft tissue disorders: Secondary | ICD-10-CM | POA: Diagnosis present

## 2016-02-10 DIAGNOSIS — M7021 Olecranon bursitis, right elbow: Secondary | ICD-10-CM

## 2016-02-10 DIAGNOSIS — F17200 Nicotine dependence, unspecified, uncomplicated: Secondary | ICD-10-CM | POA: Diagnosis not present

## 2016-02-10 DIAGNOSIS — Y939 Activity, unspecified: Secondary | ICD-10-CM | POA: Insufficient documentation

## 2016-02-10 DIAGNOSIS — Z79899 Other long term (current) drug therapy: Secondary | ICD-10-CM | POA: Insufficient documentation

## 2016-02-10 DIAGNOSIS — I10 Essential (primary) hypertension: Secondary | ICD-10-CM | POA: Diagnosis not present

## 2016-02-10 DIAGNOSIS — J441 Chronic obstructive pulmonary disease with (acute) exacerbation: Secondary | ICD-10-CM | POA: Diagnosis not present

## 2016-02-10 NOTE — ED Provider Notes (Signed)
[facility]   CSN: [id number] Arrival date & time: 02/10/16  1429     History   Chief Complaint Chief Complaint  Patient presents with  . Joint Swelling    HPI Spencer Banks is a 70 y.o. male.  The history is provided by the patient.  Extremity Pain  This is a recurrent problem. The current episode started more than 1 week ago. The problem occurs constantly. The problem has not changed since onset.Pertinent negatives include no chest pain and no shortness of breath. Associated symptoms comments: Right elbow swelling. Nothing aggravates the symptoms. Nothing relieves the symptoms. Treatments tried: NSAIDS and compression. The treatment provided no relief.    Past Medical History:  Diagnosis Date  . COPD (chronic obstructive pulmonary disease) (HCC)   . Hypertension   . Transient ischemic attack (TIA)     Patient Active Problem List   Diagnosis Date Noted  . Protein-calorie malnutrition, severe 04/09/2015  . COPD exacerbation (HCC) 04/08/2015  . Weight loss, unintentional 04/08/2015  . Acute hyponatremia 04/08/2015  . Hypokalemia 04/08/2015  . Acute exacerbation of COPD with asthma (HCC) 04/08/2015    Past Surgical History:  Procedure Laterality Date  . hemorrohoid surgery         Home Medications    Prior to Admission medications   Medication Sig Start Date End Date Taking? Authorizing Provider  albuterol (PROVENTIL HFA;VENTOLIN HFA) 108 (90 Base) MCG/ACT inhaler Inhale 2 puffs into the lungs every 4 (four) hours as needed for wheezing or shortness of breath. 11/23/15 11/24/15  Jonette EvaBrad Chapman, MD  albuterol (PROVENTIL HFA;VENTOLIN HFA) 108 (90 Base) MCG/ACT inhaler Inhale 2 puffs into the lungs every 6 (six) hours as needed for wheezing or shortness of breath. 04/11/15   Dorothea OgleIskra M Myers, MD  albuterol (PROVENTIL) (2.5 MG/3ML) 0.083% nebulizer solution Take 3 mLs (2.5 mg total) by nebulization every 6 (six) hours as needed for wheezing or  shortness of breath. 04/11/15   Dorothea OgleIskra M Myers, MD  amLODipine (NORVASC) 10 MG tablet Take 10 mg by mouth daily. 07/10/13   Historical Provider, MD  ASMANEX HFA 100 MCG/ACT AERO Inhale 1 puff into the lungs daily as needed (shortness of breath).  02/03/15   Historical Provider, MD  azithromycin (ZITHROMAX) 250 MG tablet Take 1 tablet (250 mg total) by mouth daily. Take 1 every day until finished. 11/23/15   Jonette EvaBrad Chapman, MD  bacitracin ointment Apply 1 application topically 2 (two) times daily. Patient not taking: Reported on 11/23/2015 02/01/15   Rachelle HoraKeri Smith, MD  budesonide (PULMICORT) 0.25 MG/2ML nebulizer solution Take 2 mLs (0.25 mg total) by nebulization 2 (two) times daily. 04/11/15   Dorothea OgleIskra M Myers, MD  CRESTOR 20 MG tablet Take 20 mg by mouth at bedtime.  08/19/14   Historical Provider, MD  guaiFENesin (MUCINEX) 600 MG 12 hr tablet Take 600 mg by mouth 2 (two) times daily as needed for cough.    Historical Provider, MD  hydrochlorothiazide (HYDRODIURIL) 25 MG tablet Take 25 mg by mouth daily. 09/06/13   Historical Provider, MD  HYDROcodone-acetaminophen (NORCO/VICODIN) 5-325 MG tablet Take 1-2 tablets by mouth every 4 (four) hours as needed. Patient not taking: Reported on 11/23/2015 02/01/15   Rachelle HoraKeri Smith, MD  levofloxacin (LEVAQUIN) 500 MG tablet Take 1 tablet (500 mg total) by mouth daily. Patient not taking: Reported on 11/23/2015 04/11/15   Dorothea OgleIskra M Myers, MD  metoprolol (LOPRESSOR) 50 MG tablet Take 50 mg by mouth 2 (two) times daily. 07/12/14  Historical Provider, MD  naproxen (NAPROSYN) 500 MG tablet Take 1 tablet (500 mg total) by mouth 2 (two) times daily. 02/04/16   Cheri FowlerKayla Rose, PA-C  OVER THE COUNTER MEDICATION Take 1 Dose by mouth daily as needed (cough). [employer] lion cough syrup    Historical Provider, MD  oxyCODONE (OXY IR/ROXICODONE) 5 MG immediate release tablet Take 1 tablet (5 mg total) by mouth every 4 (four) hours as needed for moderate pain. Patient not taking: Reported on 11/23/2015  04/11/15   Dorothea OgleIskra M Myers, MD  tiotropium (SPIRIVA) 18 MCG inhalation capsule Place 18 mcg into inhaler and inhale daily.    Historical Provider, MD    Family History History reviewed. No pertinent family history.  Social History Social History  Substance Use Topics  . Smoking status: Current Every Day Smoker  . Smokeless tobacco: Never Used  . Alcohol use No     Allergies   Patient has no known allergies.   Review of Systems Review of Systems  Respiratory: Negative for shortness of breath.   Cardiovascular: Negative for chest pain.  All other systems reviewed and are negative.    Physical Exam Updated Vital Signs BP 114/74   Pulse 61   Temp 97.5 F (36.4 C)   Resp 18   Ht 6' (1.829 m)   Wt 140 lb (63.5 kg)   SpO2 97%   BMI 18.99 kg/m   Physical Exam  Constitutional: He is oriented to person, place, and time. He appears well-developed and well-nourished. No distress.  HENT:  Head: Normocephalic and atraumatic.  Nose: Nose normal.  Eyes: Conjunctivae are normal.  Neck: Neck supple. No tracheal deviation present.  Cardiovascular: Normal rate and regular rhythm.   Pulmonary/Chest: Effort normal. No respiratory distress.  Abdominal: Soft. He exhibits no distension.  Musculoskeletal:  Swelling over right olecranon without joint stiffness, no erythema, no warmth, no induration  Neurological: He is alert and oriented to person, place, and time.  Skin: Skin is warm and dry.  Psychiatric: He has a normal mood and affect.     ED Treatments / Results  Labs (all labs ordered are listed, but only abnormal results are displayed) Labs Reviewed - No data to display  EKG  EKG Interpretation None       Radiology No results found.  Procedures Procedures (including critical care time)  Medications Ordered in ED Medications - No data to display   Initial Impression / Assessment and Plan / ED Course  I have reviewed the triage vital signs and the nursing  notes.  Pertinent labs & imaging results that were available during my care of the patient were reviewed by me and considered in my medical decision making (see chart for details).  Clinical Course     70 y.o. male presents with right elbow swelling c/w ongoing olecranon bursitis, no signs of septic bursitis or other complication currently. Continue to treat with supportive care measures, will refer for outpatient sports medicine for routine f/u. Plan to follow up with PCP as needed and return precautions discussed for worsening or new concerning symptoms.   Final Clinical Impressions(s) / ED Diagnoses   Final diagnoses:  Olecranon bursitis, right elbow    New Prescriptions New Prescriptions   No medications on file     Lyndal Pulleyaniel Fusae Florio, MD 02/11/16 0002

## 2016-02-10 NOTE — ED Triage Notes (Signed)
Pt seen here 6 days ago for same right elbow pain and swelling w/o improvement

## 2016-02-20 ENCOUNTER — Ambulatory Visit: Payer: Medicare Other | Admitting: Family Medicine

## 2016-02-24 ENCOUNTER — Ambulatory Visit: Payer: Medicare Other | Admitting: Family Medicine

## 2016-02-25 ENCOUNTER — Ambulatory Visit (INDEPENDENT_AMBULATORY_CARE_PROVIDER_SITE_OTHER): Payer: Medicare Other | Admitting: Family Medicine

## 2016-02-25 ENCOUNTER — Encounter: Payer: Self-pay | Admitting: Family Medicine

## 2016-02-25 VITALS — BP 134/79 | HR 59 | Ht 71.0 in | Wt 140.0 lb

## 2016-02-25 DIAGNOSIS — M7021 Olecranon bursitis, right elbow: Secondary | ICD-10-CM | POA: Diagnosis not present

## 2016-02-25 DIAGNOSIS — M25521 Pain in right elbow: Secondary | ICD-10-CM | POA: Diagnosis present

## 2016-02-25 MED ORDER — METHYLPREDNISOLONE ACETATE 40 MG/ML IJ SUSP
40.0000 mg | Freq: Once | INTRAMUSCULAR | Status: AC
  Administered 2016-02-25: 40 mg via INTRA_ARTICULAR

## 2016-02-25 NOTE — Progress Notes (Signed)
PCP: Dr. Royal Hawthorn  Subjective:   HPI: Patient is a 71 y.o. male here for right elbow swelling.  Patient reports he woke up with right elbow swelling on 12/22. No known injury or trauma. No fevers, chills, sweats, rash. Pain level is 0/10. Resting elbow on something bothers him - a dull uncomfortable pain. No other skin changes, numbness.  Past Medical History:  Diagnosis Date  . COPD (chronic obstructive pulmonary disease) (HCC)   . Hypertension   . Transient ischemic attack (TIA)     Current Outpatient Prescriptions on File Prior to Visit  Medication Sig Dispense Refill  . albuterol (PROVENTIL HFA;VENTOLIN HFA) 108 (90 Base) MCG/ACT inhaler Inhale 2 puffs into the lungs every 6 (six) hours as needed for wheezing or shortness of breath. 1 Inhaler 3  . albuterol (PROVENTIL) (2.5 MG/3ML) 0.083% nebulizer solution Take 3 mLs (2.5 mg total) by nebulization every 6 (six) hours as needed for wheezing or shortness of breath. 500 mL 3  . amLODipine (NORVASC) 10 MG tablet Take 10 mg by mouth daily.    Christie Beckers HFA 100 MCG/ACT AERO Inhale 1 puff into the lungs daily as needed (shortness of breath).   0  . budesonide (PULMICORT) 0.25 MG/2ML nebulizer solution Take 2 mLs (0.25 mg total) by nebulization 2 (two) times daily. 60 mL 3  . hydrochlorothiazide (HYDRODIURIL) 25 MG tablet Take 25 mg by mouth daily.    . metoprolol (LOPRESSOR) 50 MG tablet Take 50 mg by mouth 2 (two) times daily.  0  . tiotropium (SPIRIVA) 18 MCG inhalation capsule Place 18 mcg into inhaler and inhale daily.     No current facility-administered medications on file prior to visit.     Past Surgical History:  Procedure Laterality Date  . hemorrohoid surgery      No Known Allergies  Social History   Social History  . Marital status: Single    Spouse name: N/A  . Number of children: N/A  . Years of education: N/A   Occupational History  . Not on file.   Social History Main Topics  . Smoking status: Current  Every Day Smoker  . Smokeless tobacco: Never Used  . Alcohol use No  . Drug use: No  . Sexual activity: Not on file   Other Topics Concern  . Not on file   Social History Narrative   ** Merged History Encounter **        No family history on file.  BP 134/79   Pulse (!) 59   Ht  (1.803 m)   Wt 140 lb (63.5 kg)   BMI 19.53 kg/m   Review of Systems: See HPI above.     Objective:  Physical Exam:  Gen: NAD, comfortable in exam room  Right elbow: Swelling of olecranon bursa.  No other swelling, bruising, deformity. No TTP over bursa or elsewhere about elbow. FROM without pain. Collateral ligaments intact. NVI distally.  Left elbow: FROM without pain.   Assessment & Plan:  1. Right elbow olecranon bursitis - no evidence infection.  Discussed options and he would like to go ahead with aspiration and injection which was performed today.  Icing, compression.  Consider elbow pad for comfort.  Tylenol or aleve only if needed.  F/u in 1 month or prn.  After informed written consent patient was seated on exam table.  Area overlying right olecranon bursa prepped with alcohol swab, 2mL of bupivicaine given for local anesthesia.  Then using 18g needle on 20cc  syringe, 23mL serosanguinous fluid aspirated from right olecranon bursa followed by injection of 1mL depomedrol.  Patient tolerated procedure well without immediate complications.

## 2016-02-25 NOTE — Assessment & Plan Note (Signed)
no evidence infection.  Discussed options and he would like to go ahead with aspiration and injection which was performed today.  Icing, compression.  Consider elbow pad for comfort.  Tylenol or aleve only if needed.  F/u in 1 month or prn.  After informed written consent patient was seated on exam table.  Area overlying right olecranon bursa prepped with alcohol swab, 2mL of bupivicaine given for local anesthesia.  Then using 18g needle on 20cc syringe, 23mL serosanguinous fluid aspirated from right olecranon bursa followed by injection of 1mL depomedrol.  Patient tolerated procedure well without immediate complications.

## 2016-02-25 NOTE — Patient Instructions (Signed)
You have olecranon bursitis. Ice the area 3-4 times a day for 15 minutes at a time Compression still for at least the next week to keep this down. We aspirated and injected this today. Consider elbow pad for protection to prevent irritation and additional swelling. Follow up with me in 1 month or as needed.

## 2016-03-02 ENCOUNTER — Inpatient Hospital Stay (HOSPITAL_COMMUNITY)
Admission: EM | Admit: 2016-03-02 | Discharge: 2016-03-18 | DRG: 871 | Disposition: E | Payer: Medicare Other | Attending: Emergency Medicine | Admitting: Emergency Medicine

## 2016-03-02 ENCOUNTER — Emergency Department (HOSPITAL_COMMUNITY): Payer: Medicare Other

## 2016-03-02 ENCOUNTER — Encounter (HOSPITAL_COMMUNITY): Payer: Self-pay

## 2016-03-02 DIAGNOSIS — I10 Essential (primary) hypertension: Secondary | ICD-10-CM | POA: Diagnosis present

## 2016-03-02 DIAGNOSIS — E871 Hypo-osmolality and hyponatremia: Secondary | ICD-10-CM | POA: Diagnosis present

## 2016-03-02 DIAGNOSIS — F17200 Nicotine dependence, unspecified, uncomplicated: Secondary | ICD-10-CM | POA: Diagnosis present

## 2016-03-02 DIAGNOSIS — I469 Cardiac arrest, cause unspecified: Secondary | ICD-10-CM | POA: Diagnosis not present

## 2016-03-02 DIAGNOSIS — R001 Bradycardia, unspecified: Secondary | ICD-10-CM | POA: Diagnosis present

## 2016-03-02 DIAGNOSIS — D6489 Other specified anemias: Secondary | ICD-10-CM | POA: Diagnosis present

## 2016-03-02 DIAGNOSIS — E872 Acidosis: Secondary | ICD-10-CM | POA: Diagnosis present

## 2016-03-02 DIAGNOSIS — R680 Hypothermia, not associated with low environmental temperature: Secondary | ICD-10-CM | POA: Diagnosis present

## 2016-03-02 DIAGNOSIS — J101 Influenza due to other identified influenza virus with other respiratory manifestations: Secondary | ICD-10-CM | POA: Diagnosis present

## 2016-03-02 DIAGNOSIS — M7021 Olecranon bursitis, right elbow: Secondary | ICD-10-CM | POA: Diagnosis present

## 2016-03-02 DIAGNOSIS — Z79899 Other long term (current) drug therapy: Secondary | ICD-10-CM

## 2016-03-02 DIAGNOSIS — R197 Diarrhea, unspecified: Secondary | ICD-10-CM | POA: Diagnosis present

## 2016-03-02 DIAGNOSIS — Z7951 Long term (current) use of inhaled steroids: Secondary | ICD-10-CM

## 2016-03-02 DIAGNOSIS — J9602 Acute respiratory failure with hypercapnia: Secondary | ICD-10-CM | POA: Diagnosis present

## 2016-03-02 DIAGNOSIS — E876 Hypokalemia: Secondary | ICD-10-CM | POA: Diagnosis present

## 2016-03-02 DIAGNOSIS — Z8673 Personal history of transient ischemic attack (TIA), and cerebral infarction without residual deficits: Secondary | ICD-10-CM

## 2016-03-02 DIAGNOSIS — J441 Chronic obstructive pulmonary disease with (acute) exacerbation: Secondary | ICD-10-CM | POA: Diagnosis present

## 2016-03-02 DIAGNOSIS — I471 Supraventricular tachycardia: Secondary | ICD-10-CM | POA: Diagnosis present

## 2016-03-02 DIAGNOSIS — N289 Disorder of kidney and ureter, unspecified: Secondary | ICD-10-CM | POA: Diagnosis present

## 2016-03-02 DIAGNOSIS — R6521 Severe sepsis with septic shock: Secondary | ICD-10-CM | POA: Diagnosis present

## 2016-03-02 DIAGNOSIS — R0600 Dyspnea, unspecified: Secondary | ICD-10-CM | POA: Diagnosis present

## 2016-03-02 DIAGNOSIS — R0603 Acute respiratory distress: Secondary | ICD-10-CM

## 2016-03-02 DIAGNOSIS — J9601 Acute respiratory failure with hypoxia: Secondary | ICD-10-CM | POA: Diagnosis present

## 2016-03-02 DIAGNOSIS — A419 Sepsis, unspecified organism: Principal | ICD-10-CM | POA: Diagnosis present

## 2016-03-02 DIAGNOSIS — I959 Hypotension, unspecified: Secondary | ICD-10-CM | POA: Diagnosis not present

## 2016-03-02 LAB — COMPREHENSIVE METABOLIC PANEL
ALBUMIN: 2.8 g/dL — AB (ref 3.5–5.0)
ALT: 19 U/L (ref 17–63)
AST: 49 U/L — AB (ref 15–41)
Alkaline Phosphatase: 41 U/L (ref 38–126)
Anion gap: 14 (ref 5–15)
BILIRUBIN TOTAL: 0.8 mg/dL (ref 0.3–1.2)
BUN: 33 mg/dL — AB (ref 6–20)
CHLORIDE: 98 mmol/L — AB (ref 101–111)
CO2: 20 mmol/L — ABNORMAL LOW (ref 22–32)
CREATININE: 2.25 mg/dL — AB (ref 0.61–1.24)
Calcium: 8.1 mg/dL — ABNORMAL LOW (ref 8.9–10.3)
GFR calc Af Amer: 32 mL/min — ABNORMAL LOW (ref >60)
GFR, EST NON AFRICAN AMERICAN: 27 mL/min — AB (ref >60)
Glucose, Bld: 166 mg/dL — ABNORMAL HIGH (ref 65–99)
Potassium: 3.3 mmol/L — ABNORMAL LOW (ref 3.5–5.1)
Sodium: 132 mmol/L — ABNORMAL LOW (ref 135–145)
TOTAL PROTEIN: 4.5 g/dL — AB (ref 6.5–8.1)

## 2016-03-02 LAB — CBC WITH DIFFERENTIAL/PLATELET
BASOS ABS: 0 10*3/uL (ref 0.0–0.1)
BASOS PCT: 0 %
Eosinophils Absolute: 0 10*3/uL (ref 0.0–0.7)
Eosinophils Relative: 0 %
HEMATOCRIT: 26.8 % — AB (ref 39.0–52.0)
HEMOGLOBIN: 9.4 g/dL — AB (ref 13.0–17.0)
LYMPHS PCT: 6 %
Lymphs Abs: 0.7 10*3/uL (ref 0.7–4.0)
MCH: 29.6 pg (ref 26.0–34.0)
MCHC: 35.1 g/dL (ref 30.0–36.0)
MCV: 84.3 fL (ref 78.0–100.0)
Monocytes Absolute: 0.5 10*3/uL (ref 0.1–1.0)
Monocytes Relative: 5 %
NEUTROS ABS: 10.5 10*3/uL — AB (ref 1.7–7.7)
NEUTROS PCT: 89 %
Platelets: 162 10*3/uL (ref 150–400)
RBC: 3.18 MIL/uL — AB (ref 4.22–5.81)
RDW: 13.4 % (ref 11.5–15.5)
WBC: 11.7 10*3/uL — AB (ref 4.0–10.5)

## 2016-03-02 LAB — I-STAT ARTERIAL BLOOD GAS, ED
Acid-base deficit: 5 mmol/L — ABNORMAL HIGH (ref 0.0–2.0)
Bicarbonate: 23.8 mmol/L (ref 20.0–28.0)
O2 SAT: 99 %
PCO2 ART: 62 mmHg — AB (ref 32.0–48.0)
PH ART: 7.189 — AB (ref 7.350–7.450)
PO2 ART: 143 mmHg — AB (ref 83.0–108.0)
Patient temperature: 97.5
TCO2: 26 mmol/L (ref 0–100)

## 2016-03-02 LAB — I-STAT CHEM 8, ED
BUN: 32 mg/dL — AB (ref 6–20)
CALCIUM ION: 1.04 mmol/L — AB (ref 1.15–1.40)
CHLORIDE: 94 mmol/L — AB (ref 101–111)
Creatinine, Ser: 2.2 mg/dL — ABNORMAL HIGH (ref 0.61–1.24)
GLUCOSE: 163 mg/dL — AB (ref 65–99)
HCT: 26 % — ABNORMAL LOW (ref 39.0–52.0)
Hemoglobin: 8.8 g/dL — ABNORMAL LOW (ref 13.0–17.0)
Potassium: 3.3 mmol/L — ABNORMAL LOW (ref 3.5–5.1)
SODIUM: 132 mmol/L — AB (ref 135–145)
TCO2: 22 mmol/L (ref 0–100)

## 2016-03-02 LAB — I-STAT CG4 LACTIC ACID, ED: Lactic Acid, Venous: 6.68 mmol/L (ref 0.5–1.9)

## 2016-03-02 LAB — I-STAT TROPONIN, ED: Troponin i, poc: 0.03 ng/mL (ref 0.00–0.08)

## 2016-03-02 MED ORDER — VANCOMYCIN HCL IN DEXTROSE 1-5 GM/200ML-% IV SOLN
1000.0000 mg | Freq: Once | INTRAVENOUS | Status: AC
  Administered 2016-03-02: 1000 mg via INTRAVENOUS
  Filled 2016-03-02: qty 200

## 2016-03-02 MED ORDER — PANTOPRAZOLE SODIUM 40 MG IV SOLR
40.0000 mg | Freq: Every day | INTRAVENOUS | Status: DC
  Administered 2016-03-03: 40 mg via INTRAVENOUS
  Filled 2016-03-02: qty 40

## 2016-03-02 MED ORDER — CALCIUM GLUCONATE 10 % IV SOLN
1.0000 g | Freq: Once | INTRAVENOUS | Status: AC
  Administered 2016-03-03: 1 g via INTRAVENOUS
  Filled 2016-03-02: qty 10

## 2016-03-02 MED ORDER — ROCURONIUM BROMIDE 50 MG/5ML IV SOLN
INTRAVENOUS | Status: DC | PRN
  Administered 2016-03-02: 65 mg via INTRAVENOUS

## 2016-03-02 MED ORDER — FENTANYL CITRATE (PF) 100 MCG/2ML IJ SOLN: 50.0000 ug | INTRAMUSCULAR | Status: DC | PRN

## 2016-03-02 MED ORDER — BUDESONIDE 0.5 MG/2ML IN SUSP
0.5000 mg | Freq: Two times a day (BID) | RESPIRATORY_TRACT | Status: DC
  Filled 2016-03-02 (×3): qty 2

## 2016-03-02 MED ORDER — LEVALBUTEROL HCL 1.25 MG/0.5ML IN NEBU
1.2500 mg | INHALATION_SOLUTION | Freq: Once | RESPIRATORY_TRACT | Status: AC
  Administered 2016-03-02: 1.25 mg via RESPIRATORY_TRACT
  Filled 2016-03-02: qty 0.5

## 2016-03-02 MED ORDER — SODIUM CHLORIDE 0.9 % IV BOLUS (SEPSIS)
1000.0000 mL | Freq: Once | INTRAVENOUS | Status: AC
  Administered 2016-03-02: 1000 mL via INTRAVENOUS

## 2016-03-02 MED ORDER — ARFORMOTEROL TARTRATE 15 MCG/2ML IN NEBU
15.0000 ug | INHALATION_SOLUTION | Freq: Two times a day (BID) | RESPIRATORY_TRACT | Status: DC
  Filled 2016-03-02: qty 2

## 2016-03-02 MED ORDER — ALBUTEROL (5 MG/ML) CONTINUOUS INHALATION SOLN: 10.0000 mg/h | INHALATION_SOLUTION | Freq: Once | RESPIRATORY_TRACT | Status: DC

## 2016-03-02 MED ORDER — FENTANYL CITRATE (PF) 100 MCG/2ML IJ SOLN
50.0000 ug | INTRAMUSCULAR | Status: AC | PRN
  Administered 2016-03-03 (×3): 50 ug via INTRAVENOUS
  Filled 2016-03-02 (×2): qty 2

## 2016-03-02 MED ORDER — KETAMINE HCL 10 MG/ML IJ SOLN
INTRAMUSCULAR | Status: DC | PRN
  Administered 2016-03-02: 60 mg via INTRAVENOUS

## 2016-03-02 MED ORDER — MIDAZOLAM HCL 2 MG/2ML IJ SOLN
1.0000 mg | INTRAMUSCULAR | Status: DC | PRN
  Administered 2016-03-03 (×2): 1 mg via INTRAVENOUS
  Filled 2016-03-02 (×3): qty 2

## 2016-03-02 MED ORDER — SODIUM CHLORIDE 0.9 % IV SOLN: 250.0000 mL | INTRAVENOUS | Status: DC | PRN

## 2016-03-02 MED ORDER — MAGNESIUM SULFATE 2 GM/50ML IV SOLN
2.0000 g | Freq: Once | INTRAVENOUS | Status: AC
  Administered 2016-03-02: 2 g via INTRAVENOUS
  Filled 2016-03-02: qty 50

## 2016-03-02 MED ORDER — SODIUM CHLORIDE 0.9 % IV SOLN
INTRAVENOUS | Status: DC
  Administered 2016-03-03: via INTRAVENOUS

## 2016-03-02 MED ORDER — PIPERACILLIN-TAZOBACTAM 3.375 G IVPB 30 MIN
3.3750 g | Freq: Once | INTRAVENOUS | Status: AC
  Administered 2016-03-02: 3.375 g via INTRAVENOUS
  Filled 2016-03-02: qty 50

## 2016-03-02 MED ORDER — IPRATROPIUM-ALBUTEROL 0.5-2.5 (3) MG/3ML IN SOLN
3.0000 mL | Freq: Four times a day (QID) | RESPIRATORY_TRACT | Status: DC
  Administered 2016-03-03: 3 mL via RESPIRATORY_TRACT
  Filled 2016-03-02 (×2): qty 3

## 2016-03-02 MED ORDER — MIDAZOLAM HCL 2 MG/2ML IJ SOLN
1.0000 mg | INTRAMUSCULAR | Status: DC | PRN
  Filled 2016-03-02: qty 2

## 2016-03-02 MED ORDER — PROPOFOL 1000 MG/100ML IV EMUL
5.0000 ug/kg/min | Freq: Once | INTRAVENOUS | Status: AC
  Administered 2016-03-02: 10 ug/kg/min via INTRAVENOUS
  Filled 2016-03-02: qty 100

## 2016-03-02 MED ORDER — HEPARIN SODIUM (PORCINE) 5000 UNIT/ML IJ SOLN: 5000.0000 [IU] | Freq: Three times a day (TID) | INTRAMUSCULAR | Status: DC

## 2016-03-02 MED ORDER — KETAMINE HCL-SODIUM CHLORIDE 100-0.9 MG/10ML-% IV SOSY
PREFILLED_SYRINGE | INTRAVENOUS | Status: AC
  Filled 2016-03-02: qty 10

## 2016-03-02 MED ORDER — FENTANYL CITRATE (PF) 100 MCG/2ML IJ SOLN
50.0000 ug | INTRAMUSCULAR | Status: DC | PRN
  Filled 2016-03-02 (×2): qty 2

## 2016-03-02 NOTE — ED Triage Notes (Signed)
Pt arrived via [facility] from home c/o respiratory distress x4 days.  Arrived on CPAP, EMS gave total of 10mg  Albuterol, 1mg  Atrovent, 125mg  Solumedrol.

## 2016-03-02 NOTE — ED Notes (Signed)
Propofol paused at this time, pt tolerating vent without sedation. Spoke to Dr.Isaacs concerning hypotension with propofol, orders to be changed

## 2016-03-02 NOTE — ED Notes (Signed)
Dr.Page aware of blood pressure

## 2016-03-02 NOTE — ED Provider Notes (Signed)
[facility] DEPT Provider Note   CSN: [id number] Arrival date & time:        History   Chief Complaint Chief Complaint  Patient presents with  . Respiratory Distress    HPI Spencer Banks is a 71 y.o. male.  HPI 71 year old male with a history of COPD and hypertension presenting EMS for respiratory distress. He says that he has had 4 days of worsening shortness of breath, cough, sputum production. He denies fevers but has had diarrhea for the past couple days he states he smokes a pack and a half a day. History of breath acutely worsened this afternoon. EMS placed him on CPAP and gave her a DuoNeb and 125 Solu-Medrol. He was then taken off CPAP due to a systolic pressure in the 09F. He denies chest pain, headache, abdominal pain, nausea, vomiting. He states that he feels slightly better after the DuoNeb still feels very dyspneic. no exacerbating factors.  Past Medical History:  Diagnosis Date  . COPD (chronic obstructive pulmonary disease) ([place])   . Hypertension   . Transient ischemic attack (TIA)     Patient Active Problem List   Diagnosis Date Noted  . Olecranon bursitis of right elbow 02/25/2016  . Protein-calorie malnutrition, severe 04/09/2015  . COPD exacerbation ([place]) 04/08/2015  . Weight loss, unintentional 04/08/2015  . Acute hyponatremia 04/08/2015  . Hypokalemia 04/08/2015  . Acute exacerbation of COPD with asthma ([place]) 04/08/2015    Past Surgical History:  Procedure Laterality Date  . hemorrohoid surgery         Home Medications    Prior to Admission medications   Medication Sig Start Date End Date Taking? Authorizing Provider  albuterol (PROVENTIL HFA;VENTOLIN HFA) 108 (90 Base) MCG/ACT inhaler Inhale 2 puffs into the lungs every 6 (six) hours as needed for wheezing or shortness of breath. 04/11/15   Theodis Blaze, MD  albuterol (PROVENTIL) (2.5 MG/3ML) 0.083% nebulizer solution Take 3 mLs (2.5 mg total) by nebulization every 6 (six) hours as  needed for wheezing or shortness of breath. 04/11/15   Theodis Blaze, MD  amLODipine (NORVASC) 10 MG tablet Take 10 mg by mouth daily. 07/10/13   Historical Provider, MD  ASMANEX HFA 100 MCG/ACT AERO Inhale 1 puff into the lungs daily as needed (shortness of breath).  02/03/15   Historical Provider, MD  budesonide (PULMICORT) 0.25 MG/2ML nebulizer solution Take 2 mLs (0.25 mg total) by nebulization 2 (two) times daily. 04/11/15   Theodis Blaze, MD  hydrochlorothiazide (HYDRODIURIL) 25 MG tablet Take 25 mg by mouth daily. 09/06/13   Historical Provider, MD  lovastatin (MEVACOR) 20 MG tablet  01/27/16   Historical Provider, MD  metoprolol (LOPRESSOR) 50 MG tablet Take 50 mg by mouth 2 (two) times daily. 07/12/14   Historical Provider, MD  tiotropium (SPIRIVA) 18 MCG inhalation capsule Place 18 mcg into inhaler and inhale daily.    Historical Provider, MD    Family History History reviewed. No pertinent family history.  Social History Social History  Substance Use Topics  . Smoking status: Current Every Day Smoker  . Smokeless tobacco: Never Used  . Alcohol use No     Allergies   Patient has no known allergies.   Review of Systems Review of Systems  Unable to perform ROS: Acuity of condition     Physical Exam Updated Vital Signs BP 128/70   Pulse 109   Temp 99.5 F (37.5 C) (Rectal)   Resp 18   Ht 6' (1.829 m)  Wt 63.5 kg   SpO2 100%   BMI 18.99 kg/m   Physical Exam  Constitutional: He appears cachectic. He has a sickly appearance.  Tripoding   HENT:  Head: Normocephalic and atraumatic.  Mouth/Throat: Uvula is midline, oropharynx is clear and moist and mucous membranes are normal.  Eyes: EOM are normal. Pupils are equal, round, and reactive to light.  Neck: Trachea normal, normal range of motion and full passive range of motion without pain. Neck supple. No spinous process tenderness and no muscular tenderness present. No tracheal deviation and normal range of motion  present.  Cardiovascular: Regular rhythm, S1 normal, S2 normal, normal heart sounds, intact distal pulses and normal pulses.  Tachycardia present.  Exam reveals no gallop and no friction rub.   No murmur heard. Pulmonary/Chest: Accessory muscle usage present. Tachypnea noted. He is in respiratory distress. He has decreased breath sounds (severe). He has wheezes (inspiratory and expiratory throughout).  Abdominal: Soft. He exhibits no distension. There is no tenderness.  Musculoskeletal: Normal range of motion. He exhibits no edema, tenderness or deformity.  Neurological: He is alert.  Skin: Skin is warm. Capillary refill takes less than 2 seconds.  Nursing note and vitals reviewed.    ED Treatments / Results  Labs (all labs ordered are listed, but only abnormal results are displayed) Labs Reviewed  CBC WITH DIFFERENTIAL/PLATELET - Abnormal; Notable for the following:       Result Value   WBC 11.7 (*)    RBC 3.18 (*)    Hemoglobin 9.4 (*)    HCT 26.8 (*)    Neutro Abs 10.5 (*)    All other components within normal limits  COMPREHENSIVE METABOLIC PANEL - Abnormal; Notable for the following:    Sodium 132 (*)    Potassium 3.3 (*)    Chloride 98 (*)    CO2 20 (*)    Glucose, Bld 166 (*)    BUN 33 (*)    Creatinine, Ser 2.25 (*)    Calcium 8.1 (*)    Total Protein 4.5 (*)    Albumin 2.8 (*)    AST 49 (*)    GFR calc non Af Amer 27 (*)    GFR calc Af Amer 32 (*)    All other components within normal limits  I-STAT CHEM 8, ED - Abnormal; Notable for the following:    Sodium 132 (*)    Potassium 3.3 (*)    Chloride 94 (*)    BUN 32 (*)    Creatinine, Ser 2.20 (*)    Glucose, Bld 163 (*)    Calcium, Ion 1.04 (*)    Hemoglobin 8.8 (*)    HCT 26.0 (*)    All other components within normal limits  I-STAT CG4 LACTIC ACID, ED - Abnormal; Notable for the following:    Lactic Acid, Venous 6.68 (*)    All other components within normal limits  I-STAT ARTERIAL BLOOD GAS, ED -  Abnormal; Notable for the following:    pH, Arterial 7.189 (*)    pCO2 arterial 62.0 (*)    pO2, Arterial 143.0 (*)    Acid-base deficit 5.0 (*)    All other components within normal limits  CULTURE, BLOOD (ROUTINE X 2)  CULTURE, BLOOD (ROUTINE X 2)  URINALYSIS, ROUTINE W REFLEX MICROSCOPIC  I-STAT TROPOININ, ED    EKG  EKG Interpretation None       Radiology Dg Chest Portable 1 View  Result Date: 03/06/2016 CLINICAL DATA:  Respiratory distress  for 4 days. Endotracheal and OG tube placement. EXAM: PORTABLE CHEST 1 VIEW COMPARISON:  11/23/2015 FINDINGS: Tip of the endotracheal tube is 7.4 cm from the carina at the level of the thoracic inlet. Enteric tube is in place, tip and side-port up below the diaphragm, not included in the field of view, however likely in the stomach. The lungs are hyperinflated. There is mild central bronchial thickening. Normal heart size and mediastinal contours. There is atherosclerosis of the aortic arch. No focal airspace disease, pleural fluid or pneumothorax. No acute osseous abnormalities are seen. IMPRESSION: 1. Endotracheal and enteric tubes in place. 2. Hyperinflation and bronchial thickening, suggesting emphysema. 3. Thoracic aortic atherosclerosis. Electronically Signed   By: Jeb Levering M.D.   On: 03/17/2016 21:55    Procedures Procedure Name: Intubation Date/Time: 02/20/2016 9:46 PM Performed by: Raetta Agostinelli Mali Pre-anesthesia Checklist: Patient identified, Emergency Drugs available, Suction available, Patient being monitored and Timeout performed Oxygen Delivery Method: Non-rebreather mask Preoxygenation: Pre-oxygenation with 100% oxygen Intubation Type: Rapid sequence Laryngoscope Size: Mac and 3 Grade View: Grade II Tube size: 7.5 mm Number of attempts: 1 Airway Equipment and Method: Rigid stylet and Video-laryngoscopy Placement Confirmation: ETT inserted through vocal cords under direct vision,  CO2 detector and Breath sounds  checked- equal and bilateral Secured at: 23 cm Tube secured with: ETT holder      (including critical care time)  Medications Ordered in ED Medications  Ketamine HCl-Sodium Chloride 100-0.9 MG/10ML-% SOSY (not administered)  ketamine (KETALAR) injection (60 mg Intravenous Given 02/21/2016 2121)  rocuronium (ZEMURON) injection (65 mg Intravenous Given 03/09/2016 2122)  sodium chloride 0.9 % bolus 1,000 mL (1,000 mLs Intravenous New Bag/Given 03/12/2016 2257)  piperacillin-tazobactam (ZOSYN) IVPB 3.375 g (3.375 g Intravenous New Bag/Given 02/20/2016 2315)  vancomycin (VANCOCIN) IVPB 1000 mg/200 mL premix (1,000 mg Intravenous New Bag/Given 02/23/2016 2315)  levalbuterol (XOPENEX) nebulizer solution 1.25 mg (not administered)  sodium chloride 0.9 % bolus 1,000 mL (1,000 mLs Intravenous New Bag/Given 03/08/2016 2256)  fentaNYL (SUBLIMAZE) injection 50 mcg (not administered)  fentaNYL (SUBLIMAZE) injection 50 mcg (not administered)  midazolam (VERSED) injection 1 mg (not administered)  midazolam (VERSED) injection 1 mg (not administered)  magnesium sulfate IVPB 2 g 50 mL (0 g Intravenous Stopped 02/22/2016 2258)  sodium chloride 0.9 % bolus 1,000 mL (0 mLs Intravenous Stopped 03/10/2016 2156)  propofol (DIPRIVAN) 1000 MG/100ML infusion (0 mcg/kg/min  63.5 kg Intravenous Paused 03/09/2016 2258)     Initial Impression / Assessment and Plan / ED Course  I have reviewed the triage vital signs and the nursing notes.  Pertinent labs & imaging results that were available during my care of the patient were reviewed by me and considered in my medical decision making (see chart for details).  Clinical Course    71 year old male with a history of severe COPD and respiratory distress is tripoding on exam. EMS gave solumedrol and duoneb. He was placed on BiPAP given a liter bolus and 2g mag however due to severe shortness of breath and accessory muscle use and hypoxia in the high 50s, the decision was made to intubate per  the procedure note above. Patient was on propofol for sedation CBC, CMP, ABG, Lactic acid, Trop, EKG ordered. CXR shows hyperinflation bronchial thickening, no focal opacity or PTX.  Labs notable for leukocytosis of 54.2, metabolic acidosis with an acute kidney injury with a creatinine of 2.2. Lactic acid 6.68. Troponin is negative. Concern for sepsis. Septic protocol initiated and patient given vancomycin and Zosyn for empiric  Coverage. No pneumonia on chest x-ray however patient was complaining of diarrhea and abdominal pain.  Patient discussed with the intensivist will be admitted to the ICU for further evaluation and management.  Final Clinical Impressions(s) / ED Diagnoses   Final diagnoses:  Respiratory distress    New Prescriptions New Prescriptions   No medications on file     Zierra Laroque Mali Mykiah Schmuck, MD 03/14/16 0030    Duffy Bruce, MD 14-Mar-2016 [place], MD 03-14-16 1302

## 2016-03-02 NOTE — ED Notes (Signed)
CCM at bedside 

## 2016-03-02 NOTE — Code Documentation (Signed)
Pt A&Ox4, following commands. Pt sats 59% on bi-pap. Dr.Isaacs and Dr.Page at bedside for intubation

## 2016-03-03 ENCOUNTER — Inpatient Hospital Stay (HOSPITAL_COMMUNITY): Payer: Medicare Other

## 2016-03-03 DIAGNOSIS — R0603 Acute respiratory distress: Secondary | ICD-10-CM

## 2016-03-03 DIAGNOSIS — J9601 Acute respiratory failure with hypoxia: Secondary | ICD-10-CM

## 2016-03-03 DIAGNOSIS — R0600 Dyspnea, unspecified: Secondary | ICD-10-CM

## 2016-03-03 LAB — POCT I-STAT 3, ART BLOOD GAS (G3+)
ACID-BASE DEFICIT: 13 mmol/L — AB (ref 0.0–2.0)
Bicarbonate: 16.5 mmol/L — ABNORMAL LOW (ref 20.0–28.0)
O2 Saturation: 99 %
PH ART: 7.014 — AB (ref 7.350–7.450)
Patient temperature: 93.3
TCO2: 19 mmol/L (ref 0–100)
pCO2 arterial: 61.9 mmHg — ABNORMAL HIGH (ref 32.0–48.0)
pO2, Arterial: 165 mmHg — ABNORMAL HIGH (ref 83.0–108.0)

## 2016-03-03 LAB — URINALYSIS, ROUTINE W REFLEX MICROSCOPIC
Bilirubin Urine: NEGATIVE
Glucose, UA: NEGATIVE mg/dL
Ketones, ur: NEGATIVE mg/dL
Nitrite: NEGATIVE
Protein, ur: 30 mg/dL — AB
SPECIFIC GRAVITY, URINE: 1.015 (ref 1.005–1.030)
pH: 5 (ref 5.0–8.0)

## 2016-03-03 LAB — INFLUENZA PANEL BY PCR (TYPE A & B)
INFLBPCR: NEGATIVE
Influenza A By PCR: POSITIVE — AB

## 2016-03-03 LAB — LACTIC ACID, PLASMA: LACTIC ACID, VENOUS: 6.1 mmol/L — AB (ref 0.5–1.9)

## 2016-03-03 MED ORDER — DEXTROSE 5 % IV SOLN
1.0000 g | INTRAVENOUS | Status: DC
  Filled 2016-03-03: qty 10

## 2016-03-03 MED ORDER — SODIUM BICARBONATE 8.4 % IV SOLN
INTRAVENOUS | Status: AC
  Filled 2016-03-03: qty 200

## 2016-03-03 MED ORDER — EPINEPHRINE PF 1 MG/10ML IJ SOSY
PREFILLED_SYRINGE | INTRAMUSCULAR | Status: AC
  Filled 2016-03-03: qty 30

## 2016-03-03 MED ORDER — POTASSIUM CHLORIDE 2 MEQ/ML IV SOLN
30.0000 meq | Freq: Once | INTRAVENOUS | Status: AC
  Administered 2016-03-03: 30 meq via INTRAVENOUS
  Filled 2016-03-03: qty 15

## 2016-03-03 MED ORDER — SODIUM BICARBONATE 8.4 % IV SOLN
100.0000 meq | Freq: Once | INTRAVENOUS | Status: AC
  Administered 2016-03-03: 100 meq via INTRAVENOUS

## 2016-03-03 MED ORDER — SODIUM CHLORIDE 0.9 % IV SOLN
0.0000 ug/min | INTRAVENOUS | Status: DC
  Administered 2016-03-03: 200 ug/min via INTRAVENOUS
  Administered 2016-03-03: 20 ug/min via INTRAVENOUS
  Filled 2016-03-03 (×4): qty 1

## 2016-03-03 MED ORDER — STERILE WATER FOR INJECTION IV SOLN
INTRAVENOUS | Status: DC
  Filled 2016-03-03: qty 850

## 2016-03-03 MED ORDER — METHYLPREDNISOLONE SODIUM SUCC 40 MG IJ SOLR
40.0000 mg | Freq: Three times a day (TID) | INTRAMUSCULAR | Status: DC
  Filled 2016-03-03 (×2): qty 1

## 2016-03-04 LAB — RESPIRATORY PANEL BY PCR
Adenovirus: NOT DETECTED
Bordetella pertussis: NOT DETECTED
CHLAMYDOPHILA PNEUMONIAE-RVPPCR: NOT DETECTED
Coronavirus 229E: NOT DETECTED
Coronavirus HKU1: NOT DETECTED
Coronavirus NL63: NOT DETECTED
Coronavirus OC43: NOT DETECTED
INFLUENZA A H3-RVPPCR: DETECTED — AB
INFLUENZA B-RVPPCR: NOT DETECTED
MYCOPLASMA PNEUMONIAE-RVPPCR: NOT DETECTED
Metapneumovirus: NOT DETECTED
PARAINFLUENZA VIRUS 4-RVPPCR: NOT DETECTED
Parainfluenza Virus 1: NOT DETECTED
Parainfluenza Virus 2: NOT DETECTED
Parainfluenza Virus 3: NOT DETECTED
RESPIRATORY SYNCYTIAL VIRUS-RVPPCR: NOT DETECTED
RHINOVIRUS / ENTEROVIRUS - RVPPCR: NOT DETECTED

## 2016-03-08 ENCOUNTER — Telehealth: Payer: Self-pay

## 2016-03-08 LAB — CULTURE, BLOOD (ROUTINE X 2)
CULTURE: NO GROWTH
Culture: NO GROWTH

## 2016-03-08 NOTE — Telephone Encounter (Signed)
On 03/08/16 I [facility] The death certificate is for cremation. The patient is a patient of Doctor Byrum. The death certificate will be taken to Pulmonary Unit @ [facility] this pm for signature.  On 03/09/2016 I received the death certificate back from Doctor Byrum. I got the death certificate ready and called the funeral home to let them know the death certificate is ready for pickup. I also faxed a copy to the funeral home per their request.

## 2016-03-15 MED FILL — Medication: Qty: 1 | Status: AC

## 2016-03-18 NOTE — Discharge Summary (Signed)
PULMONARY / CRITICAL CARE MEDICINE  Name: Spencer Banks MRN: [id number] DOB: 05/12/1945 71 y.o.  Date of Admission: 02/19/2016  8:53 PM Date of Discharge: 01/31/17 Attending Physician: Leslye Peerobert S Byrum, MD  Discharge Diagnosis: 1. Influenza 2. Acute chronic obstructive pulmonary disease exacerbation 3. Acute hypoxemic and hypercarbic respiratory failure 4. Acute respiratory acidosis 5. Septic shock 6. Hyponatremia 7. Hypokalemia 8. Acute renal insufficiency 9. Chronic anemia  Cause of death: Influenza Time of death: 0432  Disposition and follow-up:   [name] [facility]    Hospital Course: 71 year old male smoker with history of COPD, HTN, TIA presenting with dyspnea. History obtained from EMR due to intubation/sedation. He has had a 4 day history of worsening dyspnea, cough, increased sputum production. No fevers. Alert and oriented x 4. EMS placed him on CPAP and administered DuoNeb and 125mg  of SoluMedrol. In the ED, he was given 2g of Mg, 3L NS bolus. His O2 sats were 59% on BiPAP. He was intubated. He was started on Vanc and Zosyn.   He had hypotension and was started on a Neo gtt. His ABG after intubation changed from pH of 7.19 to 7.01, pCO2 remained unchanged at 62. His rate was increased on his ventilator as well as his tidal volume. He was given sodium bicarb 100meq once and bicarb gtt was started at 150ml/hr. He developed bradycardia followed by PEA arrest. ACLS was initiated. CPR performed for 27 minutes with defibrillation, administration of calcium, epinephrine. No return of spontaneous circulation. Multiple attempts were made to contact his emergency contacts listed as well as contacts in his cell phone with no success. Time of death was 4:32AM. He was noted to have influenza A on a respiratory panel that had been sent in the ED.   Signed: Lora PaulaJennifer T Kimm Sider, MD 01/31/17, 6:01 AM

## 2016-03-18 NOTE — ED Notes (Signed)
RT at bedside for art line placement 

## 2016-03-18 NOTE — Code Documentation (Signed)
[facility]. ACLS protocol was already underway and code was being led by CCM who is also primary. No further assistance was needed from us at the time.   [name], MD IMTS - PGY1 Pager [phone]

## 2016-03-18 NOTE — Progress Notes (Signed)
Pharmacy Antibiotic Note  Spencer AdamWillis Banks is a 71 y.o. male admitted on 04/03/2016 with COPD exacerbation.  Pharmacy has been consulted for Ceftriaxone dosing. WBC mildly elevated. Lactic acid elevated. Resp failure requiring intubation.   Plan: -Ceftriaxone 1g IV q24h -Trend WBC, temp, renal function  -F/U infectious work-up  Height: 6' (182.9 cm) Weight: 140 lb (63.5 kg) IBW/kg (Calculated) : 77.6  Temp (24hrs), Avg:99.5 F (37.5 C), Min:99.5 F (37.5 C), Max:99.5 F (37.5 C)   Recent Labs Lab 07/04/16 2228 07/04/16 2232  WBC 11.7*  --   CREATININE 2.25* 2.20*  LATICACIDVEN  --  6.68*    Estimated Creatinine Clearance: 27.3 mL/min (by C-G formula based on SCr of 2.2 mg/dL (H)).    No Known Allergies   Spencer Banks, Spencer Banks 02/20/2016 12:04 AM

## 2016-03-18 NOTE — Progress Notes (Signed)
Vent changes made per MD order. RRT notified.

## 2016-03-18 NOTE — Code Documentation (Addendum)
CODE BLUE NOTE  Patient Name: Spencer Banks   MRN: [id number]   Date of Birth/ Sex: 08/25/1945 , male      Admission Date: March 04, 2016  Attending Provider: Leslye Peerobert S Byrum, MD  Primary Diagnosis: <principal problem not specified>    Indication: Pt was in his usual state of health until this AM, when he was noted to have bradycardia followed by PEA arrest. Code blue was subsequently called. At the time of arrival on scene, ACLS protocol was underway.    Technical Description:  CPR performance duration:  27 minutes  Was defibrillation or cardioversion used? Yes   Was external pacer placed? No  Was patient intubated pre/post CPR? Yes    Medications Administered: Y = Yes; Blank = No Amiodarone    Atropine     Calcium    Yes  Epinephrine    Yes  Lidocaine    Magnesium    Norepinephrine    Phenylephrine    Yes  Sodium bicarbonate    Yes  Vasopressin      Post CPR evaluation:  Final Status - Patient not successfully resuscitated.   Miscellaneous Information:  Labs sent, including: None.  Primary team notified?  Yes - PCCM  Family Notified? No - attempts to call contacts in chart as well as recent calls from pt's phone ("Sissy" and "Maralyn SagoSarah"), but unfortunately no answer from any contacts.     Rutherford Guysahul Desai, [place] Sidonie Dickens- C [facility] Pager: [phone]  or [phone] 03/10/2016, 5:07 AM  Alyson ReedyWesam G. Yacoub, M.D. [facility]. Pager: [phone]. After hours pager: [phone].

## 2016-03-18 NOTE — ED Notes (Signed)
Respiratory at bedside for Art line, bair hugger applied.

## 2016-03-18 NOTE — H&P (Signed)
PULMONARY / CRITICAL CARE MEDICINE   Name: Spencer Banks MRN: [id number] DOB: Apr 03, 1945    ADMISSION DATE:  03/31/2016 CONSULTATION DATE:  03/13/2016  REFERRING MD:  Erma Heritage, MD (EDP)  CHIEF COMPLAINT:  dyspnea  HISTORY OF PRESENT ILLNESS:   71 year old male smoker with history of COPD, HTN, TIA presenting with dyspnea. History obtained from EMR due to intubation/sedation. He has had a 4 day history of worsening dyspnea, cough, increased sputum production. No fevers. Alert and oriented x 4. EMS placed him on CPAP and administered DuoNeb and 125mg  of SoluMedrol. In the ED, he was given 2g of Mg, 3L NS bolus. His O2 sats were 59% on BiPAP. He was intubated. He was started on Vanc and Zosyn.   PAST MEDICAL HISTORY :  He  has a past medical history of COPD (chronic obstructive pulmonary disease) (HCC); Hypertension; and Transient ischemic attack (TIA).  PAST SURGICAL HISTORY: He  has a past surgical history that includes hemorrohoid surgery.  No Known Allergies  No current facility-administered medications on file prior to encounter.    Current Outpatient Prescriptions on File Prior to Encounter  Medication Sig  . albuterol (PROVENTIL HFA;VENTOLIN HFA) 108 (90 Base) MCG/ACT inhaler Inhale 2 puffs into the lungs every 6 (six) hours as needed for wheezing or shortness of breath.  Marland Kitchen albuterol (PROVENTIL) (2.5 MG/3ML) 0.083% nebulizer solution Take 3 mLs (2.5 mg total) by nebulization every 6 (six) hours as needed for wheezing or shortness of breath.  Marland Kitchen amLODipine (NORVASC) 10 MG tablet Take 10 mg by mouth daily.  Christie Beckers HFA 100 MCG/ACT AERO Inhale 1 puff into the lungs daily as needed (shortness of breath).   . budesonide (PULMICORT) 0.25 MG/2ML nebulizer solution Take 2 mLs (0.25 mg total) by nebulization 2 (two) times daily.  . hydrochlorothiazide (HYDRODIURIL) 25 MG tablet Take 25 mg by mouth daily.  Marland Kitchen lovastatin (MEVACOR) 20 MG tablet   . metoprolol (LOPRESSOR) 50 MG tablet Take 50  mg by mouth 2 (two) times daily.  Marland Kitchen tiotropium (SPIRIVA) 18 MCG inhalation capsule Place 18 mcg into inhaler and inhale daily.    FAMILY HISTORY:  His has no family status information on file.    SOCIAL HISTORY: He  reports that he has been smoking.  He has never used smokeless tobacco. He reports that he does not drink alcohol or use drugs.  REVIEW OF SYSTEMS:   Unable to obtain due to intubation  SUBJECTIVE:  Intubated, unresponsive  VITAL SIGNS: BP 125/66   Pulse 109   Temp 99.5 F (37.5 C) (Rectal)   Resp 18   Ht 6' (1.829 m)   Wt 140 lb (63.5 kg)   SpO2 100%   BMI 18.99 kg/m   HEMODYNAMICS:    VENTILATOR SETTINGS: FiO2 (%):  [40 %] 40 %  INTAKE / OUTPUT: No intake/output data recorded.  PHYSICAL EXAMINATION: General:  NAD, unresponsive Neuro:  Few nonpurposeful movements of LE, no response to painful stimuli otherwise, no gag reflex, negative babinski, negative dolls eyes HEENT:  [place]/AT, PERRL, ETT in place Cardiovascular:  Tachycardic Lungs:  Few rhonchi bilaterally, no wheezes Abdomen:  Soft, distended Musculoskeletal:  No LE edema Skin:  Warm, dry  Sepsis - Repeat Assessment  Performed at:   03/07/2016 12:00AM  Vitals     Blood pressure 114/69, pulse 107, temperature 99.5 F (37.5 C), temperature source Rectal, resp. rate 18, height 6' (1.829 m), weight 140 lb (63.5 kg), SpO2  100 %.  Heart:     Tachycardic  Lungs:    Rhonchi  Capillary Refill:   > 2 sec  Peripheral Pulse:   Radial pulse palpable  Skin:     Dry   LABS:  BMET  Recent Labs Lab 02/21/2016 2228 02/21/2016 2232  NA 132* 132*  K 3.3* 3.3*  CL 98* 94*  CO2 20*  --   BUN 33* 32*  CREATININE 2.25* 2.20*  GLUCOSE 166* 163*    Electrolytes  Recent Labs Lab 03/11/2016 2228  CALCIUM 8.1*    CBC  Recent Labs Lab 02/28/2016 2228 03/05/2016 2232  WBC 11.7*  --   HGB 9.4* 8.8*  HCT 26.8* 26.0*  PLT 162  --     Coag's No results for input(s): APTT, INR in the last 168  hours.  Sepsis Markers  Recent Labs Lab 03/05/2016 2232  LATICACIDVEN 6.68*    ABG  Recent Labs Lab 02/23/2016 2230  PHART 7.189*  PCO2ART 62.0*  PO2ART 143.0*    Liver Enzymes  Recent Labs Lab 03/08/2016 2228  AST 49*  ALT 19  ALKPHOS 41  BILITOT 0.8  ALBUMIN 2.8*    Cardiac Enzymes No results for input(s): TROPONINI, PROBNP in the last 168 hours.  Glucose No results for input(s): GLUCAP in the last 168 hours.  Imaging Dg Chest Portable 1 View  Result Date: 02/27/2016 CLINICAL DATA:  Respiratory distress for 4 days. Endotracheal and OG tube placement. EXAM: PORTABLE CHEST 1 VIEW COMPARISON:  11/23/2015 FINDINGS: Tip of the endotracheal tube is 7.4 cm from the carina at the level of the thoracic inlet. Enteric tube is in place, tip and side-port up below the diaphragm, not included in the field of view, however likely in the stomach. The lungs are hyperinflated. There is mild central bronchial thickening. Normal heart size and mediastinal contours. There is atherosclerosis of the aortic arch. No focal airspace disease, pleural fluid or pneumothorax. No acute osseous abnormalities are seen. IMPRESSION: 1. Endotracheal and enteric tubes in place. 2. Hyperinflation and bronchial thickening, suggesting emphysema. 3. Thoracic aortic atherosclerosis. Electronically Signed   By: Rubye Oaks M.D.   On: 02/20/2016 21:55     STUDIES:  CXR 1/16>> No focal airspace disease.  CULTURES: 1/17 Flu panel >> 1/16 Blood Cx >> 1/17 Respiratory Cx >>  ANTIBIOTICS: Vanc 1/16>>1/16 Zosyn 1/16>>1/16 Ceftriaxone 1/17>>  SIGNIFICANT EVENTS: 1/16 Intubated  LINES/TUBES: 1/16 ETT>>  DISCUSSION: 71 year old male smoker with history of COPD, HTN, TIA presenting with 4 day history of worsening dyspnea, cough, increased sputum production. O2 sats in ED were 59% on BiPAP and subsequently intubated.   ASSESSMENT / PLAN:  PULMONARY A: Acute exacerbation of COPD Acute hypoxic and  hypercarbic respiratory failure Acute respiratory acidosis >> pH 7.2 on admission and pCO2 62 P:   Full vent support F/u ABG Brovana BID and Pulmicort BID  Duoneb q6hr Abx as below Solu-medrol 40mg  q8hr  CARDIOVASCULAR A:  Hypotension, probably due to propofol Hx HTN P:  Repeat EKG (previous poor quality) Hold home amlodipine, HCTZ, metoprolol Goal MAP > 65  RENAL A:   Hyponatremia >> Na 132 Hypokalemia >> K 3.3 AKI >> cr 2.25 on admission Lactic acidosis >> 6.68 on admission P:   Trend lactic acid NS@100  Replete K Check Phos and Mg and follow BMP  GASTROINTESTINAL A:   No acute issues P:   Protonix for SUP  HEMATOLOGIC A:   Leukocytosis Chronic anemia >> Hgb 9.4 on admission P:  Subq Hep for VTE ppx  INFECTIOUS A:   COPD exacerbation  P:   Ceftriaxone 1/17>> F/u BCx and Resp Cx F/u respiratory viral panel F/u UA  ENDOCRINE A:   No acute issues P:   Monitor glucose  NEUROLOGIC A:   Acute encephalopathy P:   RASS goal: 0 to -1 Fent and Versed prn   FAMILY  - Updates: No family at bedside  - Inter-disciplinary family meet or Palliative Care meeting due by:  1/24   Griffin BasilJennifer Krall, MD  Internal Medicine PGY-3 Pager [phone]  Pulmonary and Critical Care Medicine [facility] Pager: [phone] 03/15/2016, 12:02 AM  Attending Note:  I have reviewed labs, studies and notes. I have discussed the case with Dr Isabella BowensKrall, and I agree with the data and plans as amended above. The patient had a hx of COPD, was admitted with dyspnea, shock, acute on chronic resp failure in the setting of flu A. Unfortunately he experienced progressive metabolic acidosis leading to PEA arrest before I was ever able to examine and formally evaluate him. ACLS  Was performed but was unsuccessful.  My review of chart notes and discussion with housestaff does not reveal any other clear source infection or metabolic acidosis that would have predicted such a  decline. His second lactate was flat, pCO2 did not change significantly after initiation of ventilation.    Levy Pupaobert Orli Degrave, MD, PhD 03/13/2016, 5:54 AM [facility] [phone] or if no answer [phone]

## 2016-03-18 NOTE — ED Notes (Signed)
Unable to obtain manual bp on either arm; strong carotid and femoral pulses. ELink contacted

## 2016-03-18 NOTE — Progress Notes (Signed)
eLink Physician-Brief Progress Note Patient Name: Spencer AdamWillis Crafts DOB: 02/14/1946 MRN: [id number]   Date of Service  02/28/2016  HPI/Events of Note  Multiple issues: 1 Hypothermia - Temp = 94.5 F and 2. Hypotension - MAP = 34.  eICU Interventions  Will order: 1. Place A-line. 2. Phenylephrine IV infusion. Titrate to MAP > 65. 3. Bair Hugger.     Intervention Category Major Interventions: Hypotension - evaluation and management;Other:  Lenell AntuSommer,Mikel Pyon Eugene 03/11/2016, 1:53 AM

## 2016-03-18 NOTE — ED Notes (Signed)
Pt has no gag reflex when CCM attempted to suction.

## 2016-03-18 NOTE — ED Notes (Signed)
Pt alert to name, tapping feet, and moving head.

## 2016-03-18 NOTE — ED Notes (Signed)
Pharmacy contacted for Neo. Spoke to Dr.Pollina regarding central line

## 2016-03-18 NOTE — Progress Notes (Signed)
eLink Physician-Brief Progress Note Patient Name: Spencer Banks DOB: 12/28/1945 MRN: [id number]   Date of Service  2016-12-03  HPI/Events of Note  BG on 40%/PRVC/TV/P 5 =  7.01/61/165/16.5   eICU Interventions/ / Will order:  1. Increase PRVC rate to 30, increase TV to 500 mL. 2. NaHCO3 100 meq IV now. 3. NaHCO3 IV infusion to run at 50 mL/hour. 4. ABG at 5:00 AM.     Intervention Category Major Interventions: Acid-Base disturbance - evaluation and management;Respiratory failure - evaluation and management  Mckaila Duffus Dennard Nipugene 2016-12-03, 3:37 AM

## 2016-03-18 DEATH — deceased

## 2017-12-31 IMAGING — CR DG CHEST 1V PORT
3 series · 3 of 3 positions shown · non-contrast
Comparison: 11/23/2015

CLINICAL DATA: Respiratory distress for 4 days. Endotracheal and OG
tube placement.

EXAM:
PORTABLE CHEST 1 VIEW

[AP (1 of 3)]
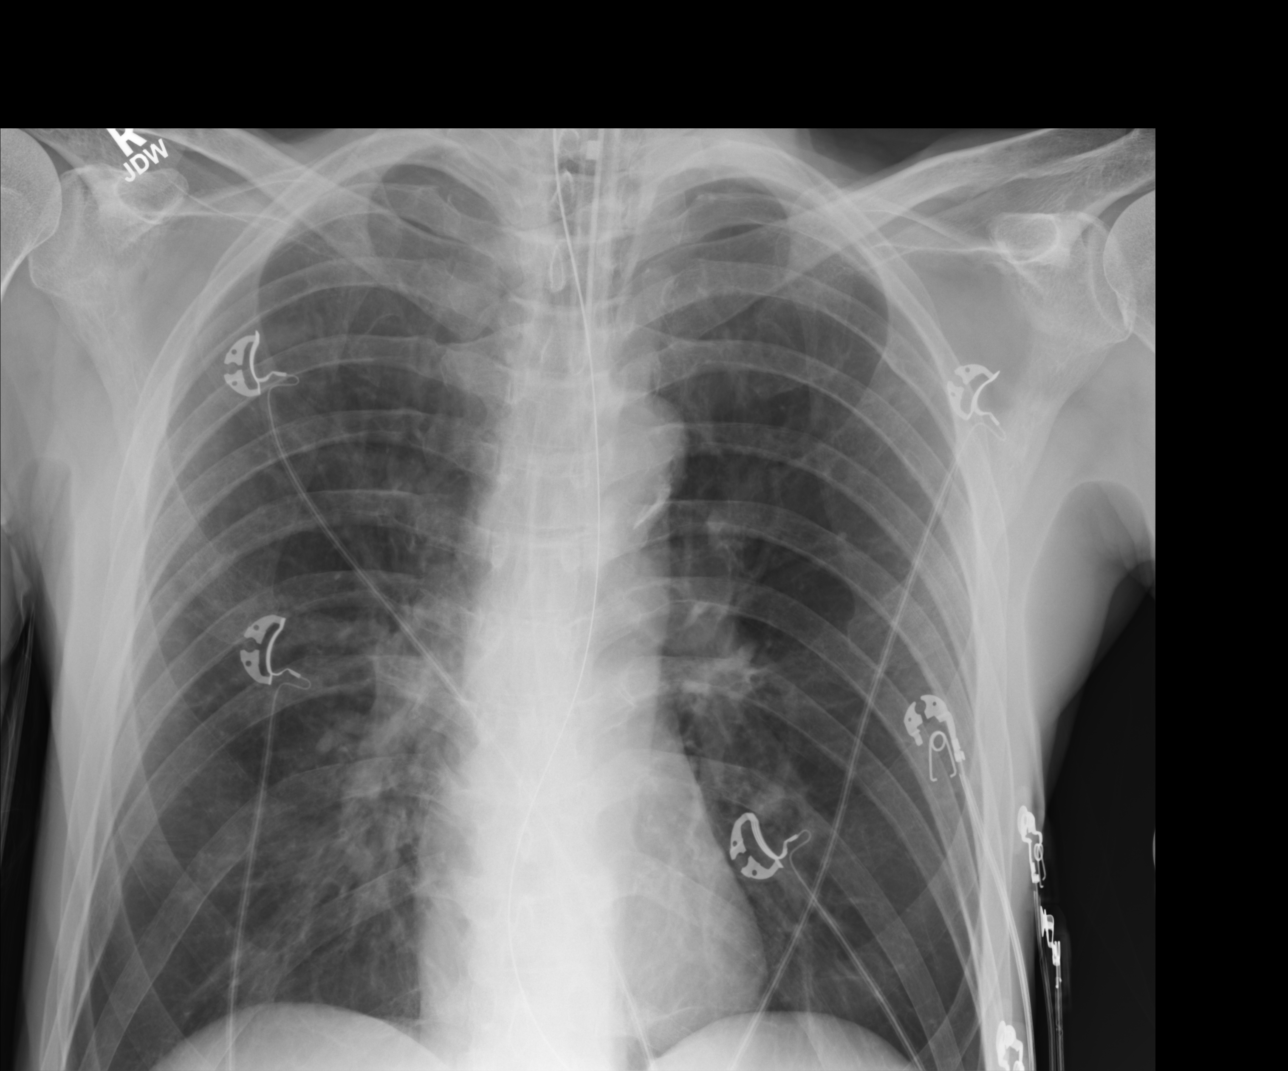

[AP (2 of 3)]
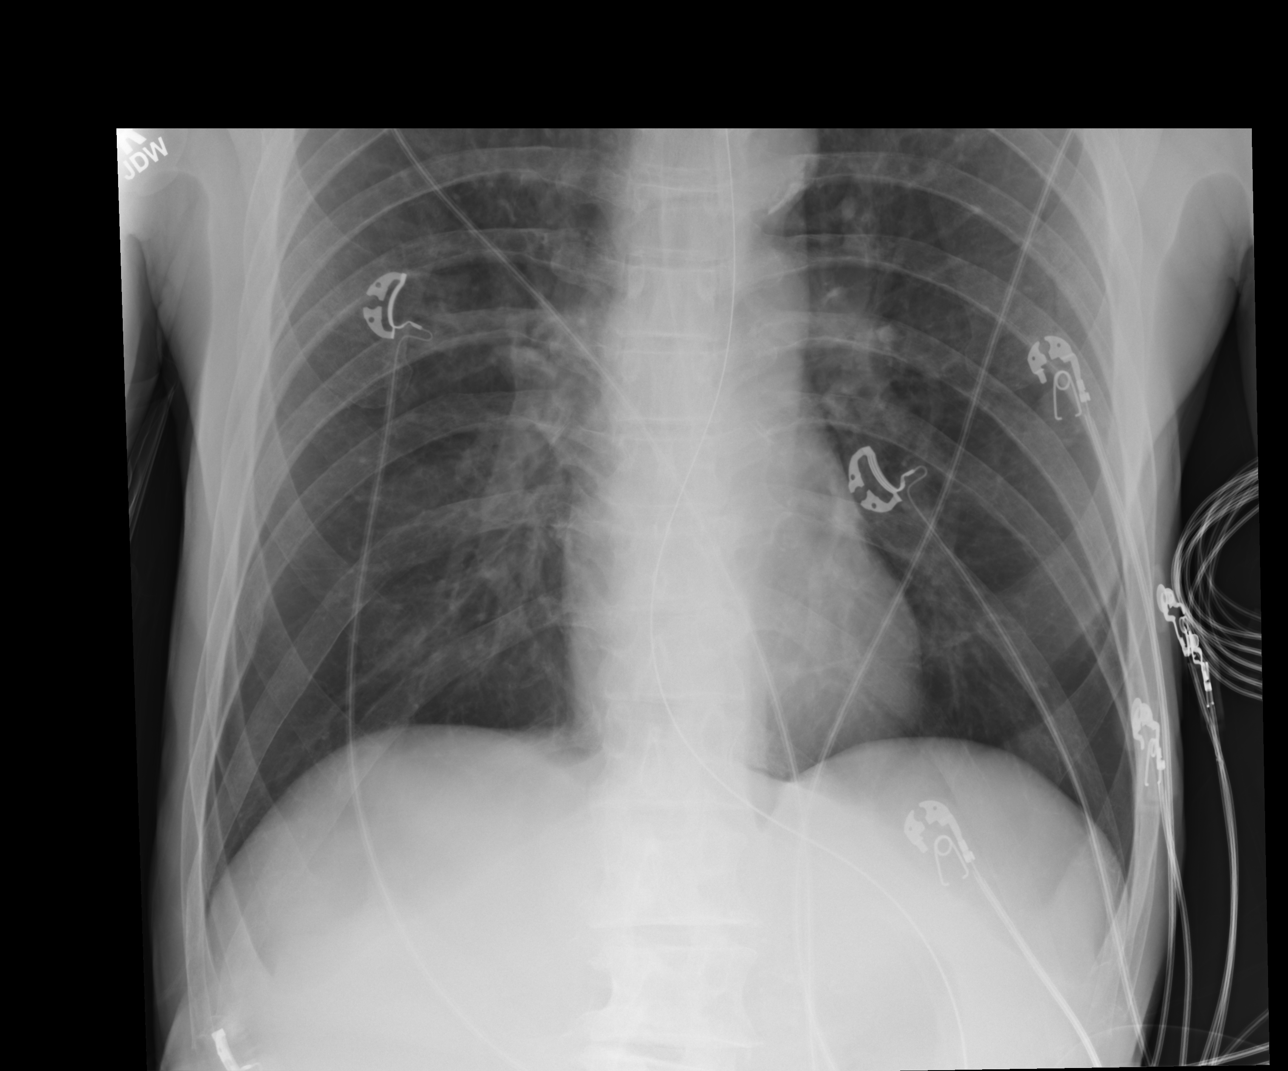

[AP (3 of 3)]
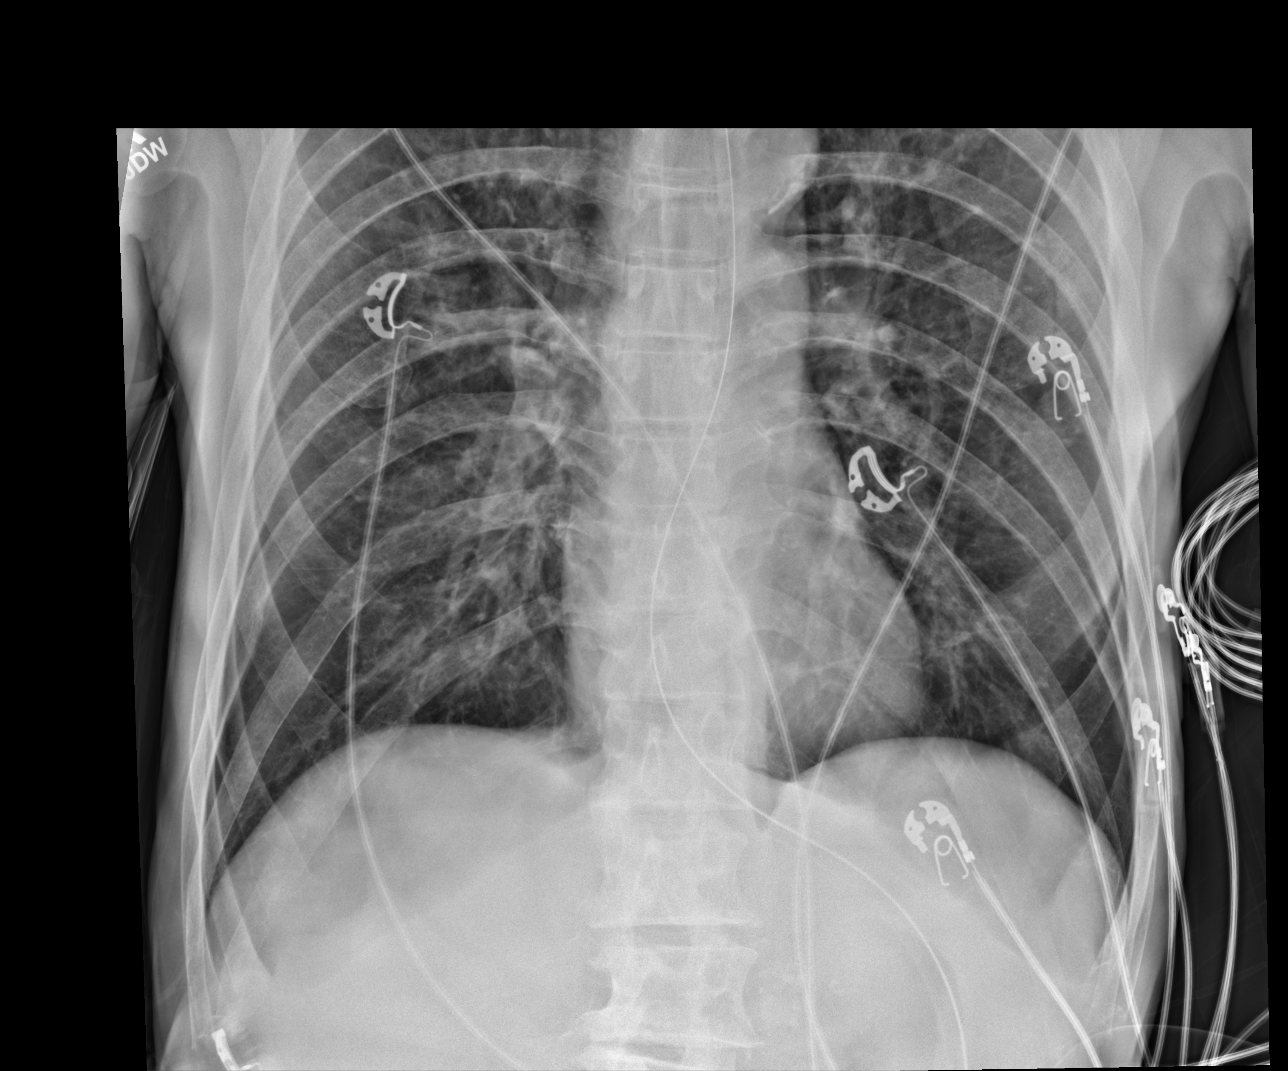

[3 of 3 positions shown; findings below may reference images not displayed]

FINDINGS: Tip of the endotracheal tube is 7.4 cm from the carina at the level
of the thoracic inlet. Enteric tube is in place, tip and side-port
up below the diaphragm, not included in the field of view, however
likely in the stomach. The lungs are hyperinflated. There is mild
central bronchial thickening. Normal heart size and mediastinal
contours. There is atherosclerosis of the aortic arch. No focal
airspace disease, pleural fluid or pneumothorax. No acute osseous
abnormalities are seen.
IMPRESSION: 1. Endotracheal and enteric tubes in place.
2. Hyperinflation and bronchial thickening, suggesting emphysema.
3. Thoracic aortic atherosclerosis.

## 2018-01-01 IMAGING — CR DG CHEST 1V PORT
1 series · 1 of 1 positions shown · non-contrast
Comparison: 03/02/2016

CLINICAL DATA: Assess ET tube position

EXAM:
PORTABLE CHEST 1 VIEW

[AP]
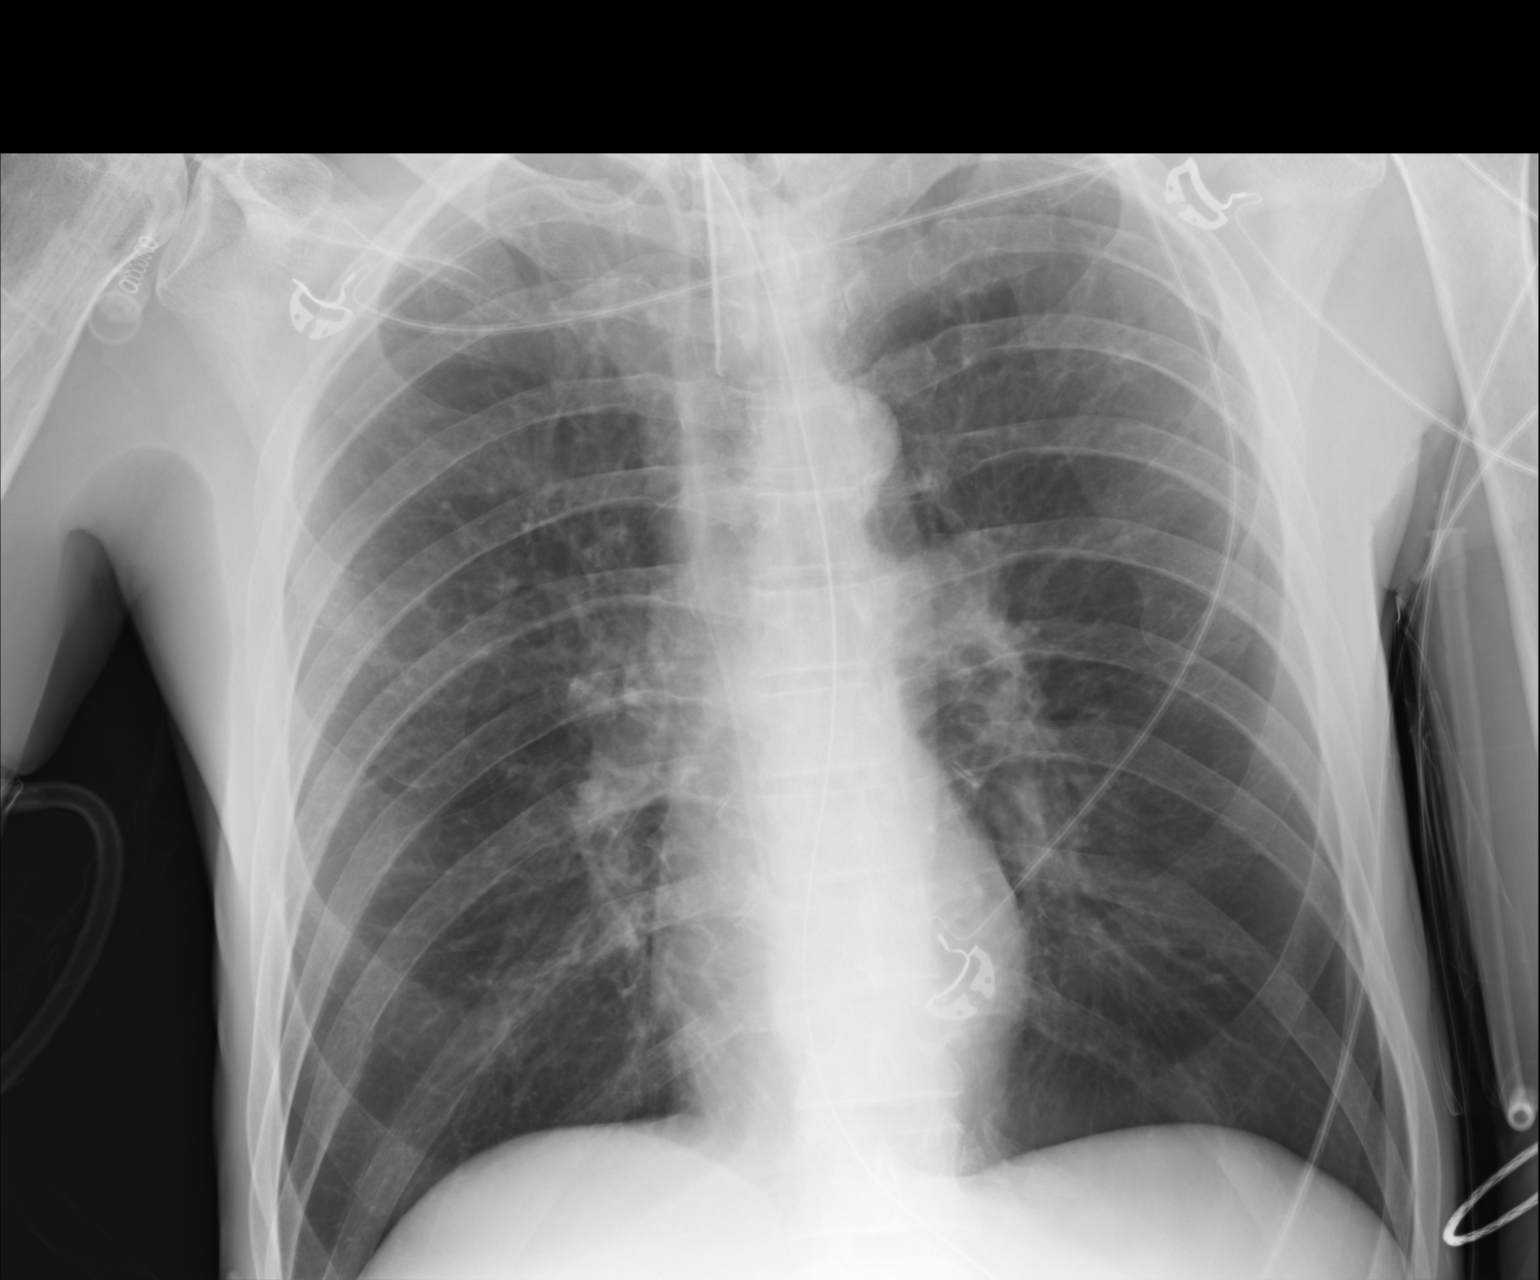

[1 of 1 positions shown; findings below may reference images not displayed]

FINDINGS: Endotracheal tube tip is approximately 5.2 cm superior to the
carina. Esophageal tube tip is below the diaphragm but is not
included.

There is hyperinflation. No acute infiltrate. Stable
cardiomediastinal silhouette. No pneumothorax.
IMPRESSION: 1. Support lines and tubes as above
2. Hyperinflation without focal infiltrate
